# Patient Record
Sex: Male | Born: 1955 | Race: White | Hispanic: No | Marital: Married | Smoking: Never smoker
Health system: Southern US, Community
[De-identification: ages and names within clinical notes are randomized; demographics above are authoritative.]

## PROBLEM LIST (undated history)

## (undated) DIAGNOSIS — K219 Gastro-esophageal reflux disease without esophagitis: Secondary | ICD-10-CM

## (undated) HISTORY — DX: Gastro-esophageal reflux disease without esophagitis: K21.9

## (undated) HISTORY — PX: OTHER SURGICAL HISTORY: SHX169

---

## 1998-01-04 ENCOUNTER — Other Ambulatory Visit: Admission: RE | Admit: 1998-01-04 | Discharge: 1998-01-04 | Payer: Self-pay | Admitting: Urology

## 2008-05-30 ENCOUNTER — Ambulatory Visit: Payer: Self-pay | Admitting: Internal Medicine

## 2008-06-04 ENCOUNTER — Encounter: Admission: RE | Admit: 2008-06-04 | Discharge: 2008-06-04 | Payer: Self-pay | Admitting: Family Medicine

## 2008-06-14 ENCOUNTER — Ambulatory Visit: Payer: Self-pay | Admitting: Internal Medicine

## 2008-06-14 ENCOUNTER — Encounter: Payer: Self-pay | Admitting: Internal Medicine

## 2008-06-16 ENCOUNTER — Encounter: Payer: Self-pay | Admitting: Internal Medicine

## 2012-10-13 ENCOUNTER — Other Ambulatory Visit (HOSPITAL_COMMUNITY): Payer: Self-pay | Admitting: Family Medicine

## 2012-10-13 DIAGNOSIS — R05 Cough: Secondary | ICD-10-CM

## 2012-10-24 ENCOUNTER — Encounter (HOSPITAL_COMMUNITY): Payer: Self-pay

## 2013-05-12 ENCOUNTER — Encounter: Payer: Self-pay | Admitting: Internal Medicine

## 2013-12-06 ENCOUNTER — Encounter: Payer: Self-pay | Admitting: Internal Medicine

## 2014-01-11 ENCOUNTER — Encounter: Payer: Self-pay | Admitting: Internal Medicine

## 2018-10-04 ENCOUNTER — Ambulatory Visit (INDEPENDENT_AMBULATORY_CARE_PROVIDER_SITE_OTHER): Payer: 59 | Admitting: Internal Medicine

## 2018-10-04 ENCOUNTER — Encounter: Payer: Self-pay | Admitting: Internal Medicine

## 2018-10-04 ENCOUNTER — Ambulatory Visit (INDEPENDENT_AMBULATORY_CARE_PROVIDER_SITE_OTHER): Payer: 59

## 2018-10-04 ENCOUNTER — Other Ambulatory Visit: Payer: Self-pay

## 2018-10-04 DIAGNOSIS — J45991 Cough variant asthma: Secondary | ICD-10-CM | POA: Insufficient documentation

## 2018-10-04 LAB — CBC WITH DIFFERENTIAL/PLATELET
Basophils Absolute: 0 10*3/uL (ref 0.0–0.1)
Basophils Relative: 0.3 % (ref 0.0–3.0)
Eosinophils Absolute: 0.2 10*3/uL (ref 0.0–0.7)
Eosinophils Relative: 2.9 % (ref 0.0–5.0)
HCT: 42.4 % (ref 39.0–52.0)
Hemoglobin: 14.2 g/dL (ref 13.0–17.0)
Lymphocytes Relative: 30.3 % (ref 12.0–46.0)
Lymphs Abs: 2 10*3/uL (ref 0.7–4.0)
MCHC: 33.6 g/dL (ref 30.0–36.0)
MCV: 90 fl (ref 78.0–100.0)
Monocytes Absolute: 0.7 10*3/uL (ref 0.1–1.0)
Monocytes Relative: 10.2 % (ref 3.0–12.0)
Neutro Abs: 3.8 10*3/uL (ref 1.4–7.7)
Neutrophils Relative %: 56.3 % (ref 43.0–77.0)
Platelets: 237 10*3/uL (ref 150.0–400.0)
RBC: 4.71 Mil/uL (ref 4.22–5.81)
RDW: 12.9 % (ref 11.5–15.5)
WBC: 6.7 10*3/uL (ref 4.0–10.5)

## 2018-10-04 MED ORDER — FAMOTIDINE 20 MG PO TABS: ORAL_TABLET | ORAL | 11 refills | Status: DC

## 2018-10-04 MED ORDER — PREDNISONE 10 MG PO TABS: ORAL_TABLET | ORAL | 0 refills | Status: DC

## 2018-10-04 MED ORDER — BENZONATATE 200 MG PO CAPS: 200.0000 mg | ORAL_CAPSULE | Freq: Three times a day (TID) | ORAL | 1 refills | Status: DC | PRN

## 2018-10-04 MED ORDER — PANTOPRAZOLE SODIUM 40 MG PO TBEC: 40.0000 mg | DELAYED_RELEASE_TABLET | Freq: Every day | ORAL | 2 refills | Status: DC

## 2018-10-04 NOTE — Patient Instructions (Addendum)
For cough > tessalon 200 mg every 6 hours as needed  Pantoprazole (protonix) 40 mg   Take  30-60 min before first meal of the day and Pepcid (famotidine)  20 mg one after supper until return to office - this is the best way to tell whether stomach acid is contributing to your problem.    Prednisone 10 mg take  4 each am x 2 days,   2 each am x 2 days,  1 each am x 2 days and stop   GERD (REFLUX)  is an extremely common cause of respiratory symptoms just like yours , many times with no obvious heartburn at all.    It can be treated with medication, but also with lifestyle changes including elevation of the head of your bed (ideally with 6 -8inch blocks under the headboard of your bed),  Smoking cessation, avoidance of late meals, excessive alcohol, and avoid fatty foods, chocolate, peppermint, colas, red wine, and acidic juices such as orange juice.  NO MINT OR MENTHOL PRODUCTS SO NO COUGH DROPS  USE SUGARLESS CANDY INSTEAD (Jolley ranchers or Stover's or Life Savers) or even ice chips will also do - the key is to swallow to prevent all throat clearing. NO OIL BASED VITAMINS - use powdered substitutes.  Avoid fish oil when coughing.  Please remember to go to the lab and x-ray department   for your tests - we will call you with the results when they are available.  Please schedule a follow up office visit in 4 weeks, sooner if needed

## 2018-10-04 NOTE — Progress Notes (Signed)
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 [place] Kyle Travis  Evolved to year round w/i n a few years not better on inhalers or otc zyrtec  s assoc sob /wheezing so self referred to pulmonary clinic 10/04/2018      History of Present Illness  10/04/2018  Pulmonary/ 1st office eval/  Chief Complaint  Patient presents with  . Pulmonary Consult    Self referral. Pt c/o non prod cough x 15 years.   Dyspnea:  Not limited by breathing from desired activities   Cough: dry day > noct but worse just hs then worse again in am p start speaking  Sleep: fine bed flat one pillow  SABA use: none  Better if chews gum or swallows an alternative med ? Extract) but only while swallowing   [name] Reflux v Neurogenic Cough Differentiator Reflux Comments  Do you awaken from a sound sleep coughing violently?                            With trouble breathing? no   Do you have choking episodes when you cannot  Get enough air, gasping for air ?              no   Do you usually cough when you lie down into  The bed, or when you just lie down to rest ?                          Immediately then resolves p a couple of cough    Do you usually cough after meals or eating?         none   Do you cough when (or after) you bend over?    none   GERD SCORE     [name] Reflux v Neurogenic Cough Differentiator Neurogenic   Do you more-or-less cough all day long? Better with swallowing   Does change of temperature make you cough? no   Does laughing or chuckling cause you to cough? yes   Do fumes (perfume, automobile fumes, burned  Toast, etc.,) cause you to cough ?      no   Does speaking, singing, or talking on the phone cause you to cough   ?               Speaking    Neurogenic/Airway score       No obvious other patterns in  day to day or daytime variability or  assoc excess/ purulent sputum or mucus plugs or hemoptysis or cp or chest tightness, subjective wheeze or overt sinus or hb symptoms.   Sleeping most nights  without nocturnal  or early am exacerbation  of respiratory  c/o's or need for noct saba. Also denies any obvious fluctuation of symptoms with weather or environmental changes or other aggravating or alleviating factors except as outlined above   No unusual exposure hx or h/o childhood pna/ asthma or knowledge of premature birth.  Current Allergies, Complete Past Medical History, Past Surgical History, Family History, and Social History were reviewed in [facility] record.  ROS  The following are not active complaints unless bolded Hoarseness, sore throat, dysphagia, dental problems, itching, sneezing,  nasal congestion  or discharge of excess mucus or purulent secretions, ear ache,   fever, chills, sweats, unintended wt loss or wt gain, classically pleuritic or exertional cp,  orthopnea pnd or arm/hand swelling  or leg swelling, presyncope, palpitations, abdominal pain, anorexia, nausea, vomiting, diarrhea  or change in bowel habits or change in bladder habits, change in stools or change in urine, dysuria, hematuria,  rash, arthralgias, visual complaints, headache, numbness, weakness or ataxia or problems with walking or coordination,  change in mood or  memory.           No past medical history on file.  Outpatient Medications Prior to Visit  Medication Sig Dispense Refill  . UNABLE TO FIND Med Name: herbal supplement     No facility-administered medications prior to visit.      Objective:     BP 110/80 (BP Location: Left Arm, Cuff Size: Normal)   Pulse (!) 108   Temp 98.8 F (37.1 C) (Oral)   Ht 6\' 1"  (1.854 m)   Wt 214 lb (97.1 kg)   SpO2 94%   BMI 28.23 kg/m   SpO2: 94 % RA   HEENT: nl dentition, turbinates bilaterally, and oropharynx. Nl external ear canals without cough reflex   NECK :   without JVD/Nodes/TM/ nl carotid upstrokes bilaterally   LUNGS: no acc muscle use,  Nl contour chest which is clear to A and P bilaterally without cough on insp or exp maneuvers   CV:  RRR  no s3 or murmur or increase in P2, and no edema   ABD:  soft and nontender with nl inspiratory excursion in the supine position. No bruits or organomegaly appreciated, bowel sounds nl  MS:  Nl gait/ ext warm without deformities, calf tenderness, cyanosis or clubbing No obvious joint restrictions   SKIN: warm and dry without lesions    NEURO:  alert, approp, nl sensorium with  no motor or cerebellar deficits apparent.    CXR PA and Lateral:   10/04/2018 :    I personally reviewed images and   impression as follows:   wnl      Labs ordered 10/04/2018  Allergy profile      Assessment   Cough variant asthma vs uacs  Onset around 2005  - Allergy profile 10/04/2018 >  Eos 0.2 /  IgE pending      Better with swallowing and worse with voice use/ absence during heavy sleep all point strongly away from asthma and toward  Upper airway cough syndrome (previously labeled PNDS),  is so named because it's frequently impossible to sort out how much is  CR/sinusitis with freq throat clearing (which can be related to primary GERD)   vs  causing  secondary (" extra esophageal")  GERD from wide swings in gastric pressure that occur with throat clearing, often  promoting self use of mint and menthol lozenges that reduce the lower esophageal sphincter tone and exacerbate the problem further in a cyclical fashion.   These are the same pts (now being labeled as having "irritable larynx syndrome" by some cough centers) who not infrequently have a history of having failed to tolerate ace inhibitors,  dry powder inhalers or biphosphonates or report having atypical/extraesophageal reflux symptoms that don't respond to standard doses of PPI  and are easily confused as having aecopd or asthma flares by even experienced  allergists/ pulmonologists (myself included).  Of the three most common causes of  Sub-acute / recurrent or chronic cough, only one (GERD)  can actually  contribute to/ trigger  the other two (asthma and post nasal drip syndrome)  and perpetuate the cylce of cough.  While not intuitively obvious, many patients with chronic low grade reflux do not cough until there is a primary insult that disturbs the protective epithelial barrier and exposes sensitive nerve endings.   This is typically viral but can due to PNDS and  either may apply here.      >>>> The point is that once this occurs, it is difficult to eliminate the cycle  using anything but a maximally effective acid suppression regimen at least in the short run, accompanied by an appropriate diet to address non acid GERD and control / eliminate the cough itself for at least 3 days with tessalon while addressing any Th-2 driven inflammation with short course prednisone then regroup in 4 weeks  The standardized cough guidelines published in Chest by Lissa Morales in 2006 are still the best available and consist of a multiple step process (up to 12!) , not a single office visit,  and are intended  to address this problem logically,  with an alogrithm dependent on response to empiric treatment at  each progressive step  to determine a specific diagnosis with  minimal addtional testing needed. Therefore if adherence is an issue or can't be accurately verified,  it's very unlikely the standard evaluation and treatment will be successful here.    Furthermore, response to therapy (other than acute cough suppression, which should only be used short term with avoidance of narcotic containing cough syrups if possible), can be a gradual process for which the patient is not likely to  perceive immediate benefit.  Unlike going to an eye doctor where the best perscription is almost always the first one and is immediately effective, this is almost never the case in  the management of chronic cough syndromes. Therefore the patient needs to commit up front to consistently adhere to recommendations  for up to 6 weeks of therapy directed at the likely underlying problem(s) before the response can be reasonably evaluated.     Total time devoted to counseling  > 50 % of initial 60 min office visit:  review case with pt/ discussion of options/alternatives/ personally creating written customized instructions  in presence of pt  then going over those specific  Instructions directly with the pt including how to use all of the meds but in particular covering each new medication in detail and the difference between the maintenance= "automatic" meds and the prns using an action plan format for the latter (If this problem/symptom => do that organization reading Left to right).  Please see AVS from this visit for a full list of these instructions which I personally wrote for this pt and  are unique to this visit.      Christinia Gully, MD 10/04/2018

## 2018-10-05 ENCOUNTER — Encounter: Payer: Self-pay | Admitting: Internal Medicine

## 2018-10-05 LAB — RESPIRATORY ALLERGY PROFILE REGION II ~~LOC~~
Allergen, A. alternata, m6: <0.1 kU/L
Allergen, Cedar tree, t12: 3.29 kU/L — ABNORMAL HIGH
Allergen, Comm Silver Birch, t9: 3.48 kU/L — ABNORMAL HIGH
Allergen, Cottonwood, t14: 3.87 kU/L — ABNORMAL HIGH
Allergen, D pternoyssinus,d7: 0.24 kU/L — ABNORMAL HIGH
Allergen, Mouse Urine Protein, e78: 0.11 kU/L — ABNORMAL HIGH
Allergen, Mulberry, t76: 3.44 kU/L — ABNORMAL HIGH
Allergen, Oak,t7: 4.71 kU/L — ABNORMAL HIGH
Allergen, P. notatum, m1: <0.1 kU/L
Aspergillus fumigatus, m3: 0.11 kU/L — ABNORMAL HIGH
Bermuda Grass: 5.55 kU/L — ABNORMAL HIGH
Box Elder IgE: 4.8 kU/L — ABNORMAL HIGH
CLADOSPORIUM HERBARUM (M2) IGE: <0.1 kU/L
COMMON RAGWEED (SHORT) (W1) IGE: 5.64 kU/L — ABNORMAL HIGH
Cat Dander: 0.15 kU/L — ABNORMAL HIGH
Class: 0
Class: 0
Class: 0
Class: 0
Class: 0
Class: 0
Class: 0
Class: 1
Class: 2
Class: 2
Class: 2
Class: 2
Class: 2
Class: 3
Class: 3
Class: 3
Class: 3
Class: 3
Class: 3
Class: 3
Class: 3
Class: 3
Class: 3
Class: 3
Cockroach: 2.87 kU/L — ABNORMAL HIGH
D. farinae: 0.9 kU/L — ABNORMAL HIGH
Dog Dander: 0.54 kU/L — ABNORMAL HIGH
Elm IgE: 4.65 kU/L — ABNORMAL HIGH
IgE (Immunoglobulin E), Serum: 173 kU/L — ABNORMAL HIGH (ref <=114)
Johnson Grass: 6.07 kU/L — ABNORMAL HIGH
Pecan/Hickory Tree IgE: 4.01 kU/L — ABNORMAL HIGH
Rough Pigweed  IgE: 4.51 kU/L — ABNORMAL HIGH
Sheep Sorrel IgE: 5.79 kU/L — ABNORMAL HIGH
Timothy Grass: 6.56 kU/L — ABNORMAL HIGH

## 2018-10-05 LAB — INTERPRETATION:

## 2018-10-05 NOTE — Progress Notes (Signed)
Spoke with pt and notified of results per Dr. Wert. Pt verbalized understanding and denied any questions. 

## 2018-10-05 NOTE — Assessment & Plan Note (Addendum)
Onset around 2005  - Allergy profile 10/04/2018 >  Eos 0.2 /  IgE     Better with swallowing and worse with voice use/ absence during heavy sleep all point strongly away from asthma and toward  Upper airway cough syndrome (previously labeled PNDS),  is so named because it's frequently impossible to sort out how much is  CR/sinusitis with freq throat clearing (which can be related to primary GERD)   vs  causing  secondary (" extra esophageal")  GERD from wide swings in gastric pressure that occur with throat clearing, often  promoting self use of mint and menthol lozenges that reduce the lower esophageal sphincter tone and exacerbate the problem further in a cyclical fashion.   These are the same pts (now being labeled as having "irritable larynx syndrome" by some cough centers) who not infrequently have a history of having failed to tolerate ace inhibitors,  dry powder inhalers or biphosphonates or report having atypical/extraesophageal reflux symptoms that don't respond to standard doses of PPI  and are easily confused as having aecopd or asthma flares by even experienced allergists/ pulmonologists (myself included).  Of the three most common causes of  Sub-acute / recurrent or chronic cough, only one (GERD)  can actually contribute to/ trigger  the other two (asthma and post nasal drip syndrome)  and perpetuate the cylce of cough.  While not intuitively obvious, many patients with chronic low grade reflux do not cough until there is a primary insult that disturbs the protective epithelial barrier and exposes sensitive nerve endings.   This is typically viral but can due to PNDS and  either may apply here.      >>>> The point is that once this occurs, it is difficult to eliminate the cycle  using anything but a maximally effective acid suppression regimen at least in the short run, accompanied by an appropriate diet to address non acid GERD and control / eliminate the cough itself for at least 3 days  with tessalon while addressing any Th-2 driven inflammation with short course prednisone then regroup in 4 weeks  The standardized cough guidelines published in Chest by Lissa Morales in 2006 are still the best available and consist of a multiple step process (up to 12!) , not a single office visit,  and are intended  to address this problem logically,  with an alogrithm dependent on response to empiric treatment at  each progressive step  to determine a specific diagnosis with  minimal addtional testing needed. Therefore if adherence is an issue or can't be accurately verified,  it's very unlikely the standard evaluation and treatment will be successful here.    Furthermore, response to therapy (other than acute cough suppression, which should only be used short term with avoidance of narcotic containing cough syrups if possible), can be a gradual process for which the patient is not likely to  perceive immediate benefit.  Unlike going to an eye doctor where the best perscription is almost always the first one and is immediately effective, this is almost never the case in the management of chronic cough syndromes. Therefore the patient needs to commit up front to consistently adhere to recommendations  for up to 6 weeks of therapy directed at the likely underlying problem(s) before the response can be reasonably evaluated.     Total time devoted to counseling  > 50 % of initial 60 min office visit:  review case with pt/ discussion of options/alternatives/ personally creating written customized instructions  in presence of pt  then going over those specific  Instructions directly with the pt including how to use all of the meds but in particular covering each new medication in detail and the difference between the maintenance= "automatic" meds and the prns using an action plan format for the latter (If this problem/symptom => do that organization reading Left to right).  Please see AVS from this visit for  a full list of these instructions which I personally wrote for this pt and  are unique to this visit.

## 2018-10-11 ENCOUNTER — Encounter: Payer: Self-pay | Admitting: Internal Medicine

## 2018-10-14 ENCOUNTER — Telehealth: Payer: Self-pay | Admitting: Internal Medicine

## 2018-10-14 MED ORDER — MONTELUKAST SODIUM 10 MG PO TABS: 10.0000 mg | ORAL_TABLET | Freq: Every day | ORAL | 1 refills | Status: DC

## 2018-10-14 NOTE — Telephone Encounter (Signed)
Called spoke with patient regarding lab results. Order placed to  [facility] per patient request. Nothing further needed at this time.

## 2018-11-01 ENCOUNTER — Other Ambulatory Visit: Payer: Self-pay

## 2018-11-01 ENCOUNTER — Encounter: Payer: Self-pay | Admitting: Internal Medicine

## 2018-11-01 ENCOUNTER — Ambulatory Visit (INDEPENDENT_AMBULATORY_CARE_PROVIDER_SITE_OTHER): Payer: 59 | Admitting: Internal Medicine

## 2018-11-01 DIAGNOSIS — J45991 Cough variant asthma: Secondary | ICD-10-CM

## 2018-11-01 MED ORDER — PANTOPRAZOLE SODIUM 40 MG PO TBEC: 40.0000 mg | DELAYED_RELEASE_TABLET | Freq: Every day | ORAL | 2 refills | Status: DC

## 2018-11-01 NOTE — Patient Instructions (Signed)
Ok to take zyrtec as needed for itching/ sneezy/  dripping nose as needed   Ok to take your herbal for cough medicine as needed   Please schedule a follow up visit in 3 months but call sooner if needed

## 2018-11-01 NOTE — Assessment & Plan Note (Signed)
Onset around 2005  - Allergy profile 10/04/2018 >  Eos 0.2 /  IgE  173 pan allergic esp grass, trees, ragweed > added singulair 10/06/2018  - 10/04/2018 max rx for gerd/ prn tessalon >>>  Resolved 11/01/2018   Adequate control on present rx, reviewed in detail with pt > no change in rx needed    F/u in 3 m and consider then weaning off acid suppression  - if continues to flare consider MCT and or allergy referral.    I had an extended discussion with the patient reviewing all relevant studies completed to date and  lasting 15 to 20 minutes of a 25 minute visit    Each maintenance medication was reviewed in detail including most importantly the difference between maintenance and prns and under what circumstances the prns are to be triggered using an action plan format that is not reflected in the computer generated alphabetically organized AVS.     Please see AVS for specific instructions unique to this visit that I personally wrote and verbalized to the the pt in detail and then reviewed with pt  by my nurse highlighting any  changes in therapy recommended at today's visit to their plan of care.

## 2018-11-01 NOTE — Progress Notes (Signed)
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 Kyle Travis  Evolved to year round w/i n a few years not better on inhalers or otc zyrtec  s assoc sob /wheezing so self referred to pulmonary clinic 10/04/2018      History of Present Illness  10/04/2018  Pulmonary/ 1st office eval/Kyle Travis  Chief Complaint  Patient presents with  . Pulmonary Consult    Self referral. Pt c/o non prod cough x 15 years.   Dyspnea:  Not limited by breathing from desired activities   Cough: dry day > noct but worse just hs then worse again in am p start speaking  Sleep: fine bed flat one pillow  SABA use: none  Better if chews gum or swallows an alternative med ? Extract) but only while swallowing   Kyle Travis v Neurogenic Cough Differentiator Travis Comments  Do you awaken from a sound sleep coughing violently?                            With trouble breathing? no   Do you have choking episodes when you cannot  Get enough air, gasping for air ?              no   Do you usually cough when you lie down into  The bed, or when you just lie down to rest ?                          Immediately then resolves p a couple of cough    Do you usually cough after meals or eating?         none   Do you cough when (or after) you bend over?    none   GERD SCORE     Kyle Travis v Neurogenic Cough Differentiator Neurogenic   Do you more-or-less cough all day long? Better with swallowing   Does change of temperature make you cough? no   Does laughing or chuckling cause you to cough? yes   Do fumes (perfume, automobile fumes, burned  Toast, etc.,) cause you to cough ?      no   Does speaking, singing, or talking on the phone cause you to cough   ?               Speaking    Neurogenic/Airway score      rec For cough > tessalon 200 mg every 6 hours as needed Pantoprazole  (protonix) 40 mg   Take  30-60 min before first meal of the day and Pepcid (famotidine)  20 mg one after supper until return to office  .   Prednisone 10 mg take  4 each am x 2 days,   2 each am x 2 days,  1 each am x 2 days and stop  GERD diet singulair added due to positive allergy profile/ zyrtec stopped    11/01/2018  f/u ov/Kyle Travis re: uacs improved on singulair/ ppi qam and pepcid hs  Chief Complaint  Patient presents with  . Follow-up    Cough is much improved. No new co's.   Dyspnea:  Not limited by breathing from desired activities   Cough: occ hs / no longer taking  any cough meds at all  Sleeping: fine flat  SABA use: no  02: no    No obvious day to day or daytime variability or assoc excess/ purulent sputum or mucus plugs or hemoptysis or cp or chest tightness, subjective wheeze or overt sinus or hb symptoms.   Sleeping  without nocturnal  or early am exacerbation  of respiratory  c/o's or need for noct saba. Also denies any obvious fluctuation of symptoms with weather or environmental changes or other aggravating or alleviating factors except as outlined above   No unusual exposure hx or h/o childhood pna/ asthma or knowledge of premature birth.  Current Allergies, Complete Past Medical History, Past Surgical History, Family History, and Social History were reviewed in [employer] record.  ROS  The following are not active complaints unless bolded Hoarseness, sore throat, dysphagia, dental problems, itching, sneezing,  nasal congestion or discharge of excess mucus or purulent secretions, ear ache,   fever, chills, sweats, unintended wt loss or wt gain, classically pleuritic or exertional cp,  orthopnea pnd or arm/hand swelling  or leg swelling, presyncope, palpitations, abdominal pain, anorexia, nausea, vomiting, diarrhea  or change in bowel habits or change in bladder habits, change in stools or change in urine, dysuria, hematuria,  rash, arthralgias,  visual complaints, headache, numbness, weakness or ataxia or problems with walking or coordination,  change in mood or  memory.        Current Meds  Medication Sig  .     . famotidine (PEPCID) 20 MG tablet One after supper  . montelukast (SINGULAIR) 10 MG tablet Take 1 tablet (10 mg total) by mouth at bedtime.  . pantoprazole (PROTONIX) 40 MG tablet Take 1 tablet (40 mg total) by mouth daily. Take 30-60 min before first meal of the day                  Objective:      amb wm nad  Wt Readings from Last 3 Encounters:  11/01/18 212 lb 9.6 oz (96.4 kg)  10/04/18 214 lb (97.1 kg)     Vital signs reviewed - Note on arrival 02 sats  96% on RA      HEENT: nl dentition, turbinates bilaterally, and oropharynx. Nl external ear canals without cough reflex   NECK :  without JVD/Nodes/TM/ nl carotid upstrokes bilaterally   LUNGS: no acc muscle use,  Nl contour chest which is clear to A and P bilaterally without cough on insp or exp maneuvers   CV:  RRR  no s3 or murmur or increase in P2, and no edema   ABD:  soft and nontender with nl inspiratory excursion in the supine position. No bruits or organomegaly appreciated, bowel sounds nl  MS:  Nl gait/ ext warm without deformities, calf tenderness, cyanosis or clubbing No obvious joint restrictions   SKIN: warm and dry without lesions    NEURO:  alert, approp, nl sensorium with  no motor or cerebellar deficits apparent.          Assessment

## 2018-11-05 ENCOUNTER — Other Ambulatory Visit: Payer: Self-pay | Admitting: Internal Medicine

## 2018-11-25 ENCOUNTER — Other Ambulatory Visit: Payer: Self-pay | Admitting: Internal Medicine

## 2018-11-25 DIAGNOSIS — J45991 Cough variant asthma: Secondary | ICD-10-CM

## 2018-12-26 ENCOUNTER — Other Ambulatory Visit: Payer: Self-pay | Admitting: Internal Medicine

## 2018-12-26 DIAGNOSIS — J45991 Cough variant asthma: Secondary | ICD-10-CM

## 2019-01-13 ENCOUNTER — Encounter: Payer: Self-pay | Admitting: Internal Medicine

## 2019-01-17 ENCOUNTER — Other Ambulatory Visit: Payer: Self-pay

## 2019-01-17 DIAGNOSIS — Z20822 Contact with and (suspected) exposure to covid-19: Secondary | ICD-10-CM

## 2019-01-19 LAB — NOVEL CORONAVIRUS, NAA: SARS-CoV-2, NAA: NOT DETECTED

## 2019-02-01 ENCOUNTER — Ambulatory Visit: Payer: 59 | Admitting: Internal Medicine

## 2019-03-16 ENCOUNTER — Other Ambulatory Visit: Payer: Self-pay | Admitting: Internal Medicine

## 2019-03-16 DIAGNOSIS — J45991 Cough variant asthma: Secondary | ICD-10-CM

## 2019-07-06 ENCOUNTER — Ambulatory Visit: Payer: 59 | Attending: Internal Medicine

## 2019-07-06 DIAGNOSIS — Z23 Encounter for immunization: Secondary | ICD-10-CM

## 2019-07-06 NOTE — Progress Notes (Signed)
   Covid-19 Vaccination Clinic  Name:  Kyle Travis    MRN: [id number] DOB: [birth year]  07/06/2019  [name] was observed post Covid-19 immunization for 15 minutes without incident. He was provided with Vaccine Information Sheet and instruction to access the V-Safe system.   [name] was instructed to call 911 with any severe reactions post vaccine: Marland Kitchen Difficulty breathing  . Swelling of face and throat  . A fast heartbeat  . A bad rash all over body  . Dizziness and weakness   Immunizations Administered    Name Date Dose VIS Date Route   Pfizer COVID-19 Vaccine 07/06/2019  1:04 PM 0.3 mL 03/17/2019 Intramuscular   Manufacturer: [place]   Lot: [id number]   [place]: [id number]

## 2019-07-31 ENCOUNTER — Ambulatory Visit: Payer: 59 | Attending: Internal Medicine

## 2019-07-31 DIAGNOSIS — Z23 Encounter for immunization: Secondary | ICD-10-CM

## 2019-07-31 NOTE — Progress Notes (Signed)
   Covid-19 Vaccination Clinic  Name:  NIYAN SCHOOF    MRN: [id number] DOB: [birth year]  07/31/2019  Mr. Kravchuk was observed post Covid-19 immunization for 15 minutes without incident. He was provided with Vaccine Information Sheet and instruction to access the V-Safe system.   Mr. Hasman was instructed to call 911 with any severe reactions post vaccine: Marland Kitchen Difficulty breathing  . Swelling of face and throat  . A fast heartbeat  . A bad rash all over body  . Dizziness and weakness   Immunizations Administered    Name Date Dose VIS Date Route   Pfizer COVID-19 Vaccine 07/31/2019  1:52 PM 0.3 mL 05/31/2018 Intramuscular   Manufacturer: [place]   Lot: [id number]   [place]: [id number]

## 2019-09-20 ENCOUNTER — Other Ambulatory Visit: Payer: Self-pay

## 2019-09-20 DIAGNOSIS — J45991 Cough variant asthma: Secondary | ICD-10-CM

## 2019-09-20 MED ORDER — PANTOPRAZOLE SODIUM 40 MG PO TBEC: DELAYED_RELEASE_TABLET | ORAL | 0 refills | Status: DC

## 2019-10-24 ENCOUNTER — Other Ambulatory Visit: Payer: Self-pay | Admitting: Internal Medicine

## 2019-10-24 DIAGNOSIS — J45991 Cough variant asthma: Secondary | ICD-10-CM

## 2019-10-24 MED ORDER — PANTOPRAZOLE SODIUM 40 MG PO TBEC: DELAYED_RELEASE_TABLET | ORAL | 0 refills | Status: DC

## 2019-12-15 ENCOUNTER — Other Ambulatory Visit: Payer: Self-pay | Admitting: Internal Medicine

## 2019-12-15 DIAGNOSIS — J45991 Cough variant asthma: Secondary | ICD-10-CM

## 2019-12-30 ENCOUNTER — Other Ambulatory Visit: Payer: Self-pay | Admitting: Internal Medicine

## 2019-12-30 DIAGNOSIS — J45991 Cough variant asthma: Secondary | ICD-10-CM

## 2019-12-30 MED ORDER — PANTOPRAZOLE SODIUM 40 MG PO TBEC: DELAYED_RELEASE_TABLET | ORAL | 0 refills | Status: DC

## 2020-01-22 ENCOUNTER — Other Ambulatory Visit: Payer: Self-pay | Admitting: Internal Medicine

## 2020-01-22 DIAGNOSIS — J45991 Cough variant asthma: Secondary | ICD-10-CM

## 2020-03-22 ENCOUNTER — Ambulatory Visit: Payer: 59 | Attending: Internal Medicine

## 2020-03-22 DIAGNOSIS — Z23 Encounter for immunization: Secondary | ICD-10-CM

## 2020-03-22 NOTE — Progress Notes (Signed)
   Covid-19 Vaccination Clinic  Name:  BREKEN NAZARI    MRN: [id number] DOB: 06/27/1955  03/22/2020  Mr. Robling was observed post Covid-19 immunization for 15 minutes without incident. He was provided with Vaccine Information Sheet and instruction to access the V-Safe system.   Mr. Mackie was instructed to call 911 with any severe reactions post vaccine: Marland Kitchen Difficulty breathing  . Swelling of face and throat  . A fast heartbeat  . A bad rash all over body  . Dizziness and weakness   Immunizations Administered    Name Date Dose VIS Date Route   Pfizer COVID-19 Vaccine 03/22/2020  1:15 PM 0.3 mL 01/24/2020 Intramuscular   Manufacturer: [place]   Lot: [id number]   [place]: [id number]

## 2020-03-23 ENCOUNTER — Ambulatory Visit: Payer: 59

## 2020-04-02 ENCOUNTER — Other Ambulatory Visit: Payer: Self-pay | Admitting: Internal Medicine

## 2020-04-02 DIAGNOSIS — J45991 Cough variant asthma: Secondary | ICD-10-CM

## 2020-04-03 ENCOUNTER — Other Ambulatory Visit: Payer: Self-pay | Admitting: Internal Medicine

## 2020-04-03 DIAGNOSIS — J45991 Cough variant asthma: Secondary | ICD-10-CM

## 2020-04-16 ENCOUNTER — Telehealth: Payer: Self-pay | Admitting: Internal Medicine

## 2020-04-16 NOTE — Telephone Encounter (Signed)
disregard

## 2020-04-25 ENCOUNTER — Other Ambulatory Visit: Payer: Self-pay

## 2020-04-25 ENCOUNTER — Ambulatory Visit (INDEPENDENT_AMBULATORY_CARE_PROVIDER_SITE_OTHER): Payer: 59 | Admitting: Internal Medicine

## 2020-04-25 ENCOUNTER — Encounter: Payer: Self-pay | Admitting: Internal Medicine

## 2020-04-25 DIAGNOSIS — J45991 Cough variant asthma: Secondary | ICD-10-CM

## 2020-04-25 MED ORDER — PANTOPRAZOLE SODIUM 40 MG PO TBEC: DELAYED_RELEASE_TABLET | ORAL | 11 refills | Status: DC

## 2020-04-25 NOTE — Assessment & Plan Note (Signed)
Onset around 2005  - Allergy profile 10/04/2018 >  Eos 0.2 /  IgE  173 pan allergic esp grass, trees, ragweed > added singulair 10/06/2018  - 10/04/2018 max rx for gerd/ prn tessalon >>>  Resolved 11/01/2018    By trial and error clear that his problem is gerd and not allergic rhinitis or asthma and that he can control this mostly with diet/ wt loss and prn ppi  Discussed the recent press about ppi's in the context of a statistically significant (but questionably clinically relevant) increase in CRI in pts on ppi vs h2's > bottom line is the lowest dose of ppi that controls   gerd is the right dose and if that dose is zero that's fine esp since h2's are cheaper.   May be able to use ppi to get cough under control then wean to otc pepcid.  Declined referral to GI "never going back"   F/u can be q 1 y  - sooner prn if PCP not willing to refill ppi (or can use prilosec otc prn to substitute)          Each maintenance medication was reviewed in detail including emphasizing most importantly the difference between maintenance and prns and under what circumstances the prns are to be triggered using an action plan format where appropriate.  Total time for H and P, chart review, counseling, reviewing  and generating customized AVS unique to this office visit / same day charting = 26 min

## 2020-04-25 NOTE — Progress Notes (Signed)
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 Spring Kyle Travis  Evolved to year round w/i n a few years not better on inhalers or otc zyrtec  s assoc sob /wheezing so self referred to pulmonary clinic 10/04/2018      History of Present Illness  10/04/2018  Pulmonary/ 1st office eval/Kyle Travis  Chief Complaint  Patient presents with  . Pulmonary Consult    Self referral. Pt c/o non prod cough x 15 years.   Dyspnea:  Not limited by breathing from desired activities   Cough: dry day > noct but worse just hs then worse again in am p start speaking  Sleep: fine bed flat one pillow  SABA use: none  Better if chews gum or swallows an alternative med ? Extract) but only while swallowing   [name] Reflux v Neurogenic Cough Differentiator Reflux Comments  Do you awaken from a sound sleep coughing violently?                            With trouble breathing? no   Do you have choking episodes when you cannot  Get enough air, gasping for air ?              no   Do you usually cough when you lie down into  The bed, or when you just lie down to rest ?                          Immediately then resolves p a couple of cough    Do you usually cough after meals or eating?         none   Do you cough when (or after) you bend over?    none   GERD SCORE     [name] Reflux v Neurogenic Cough Differentiator Neurogenic   Do you more-or-less cough all day long? Better with swallowing   Does change of temperature make you cough? no   Does laughing or chuckling cause you to cough? yes   Do fumes (perfume, automobile fumes, burned  Toast, etc.,) cause you to cough ?      no   Does speaking, singing, or talking on the phone cause you to cough   ?               Speaking    Neurogenic/Airway score      rec For cough > tessalon 200 mg every 6 hours as needed Pantoprazole  (protonix) 40 mg   Take  30-60 min before first meal of the day and Pepcid (famotidine)  20 mg one after supper until return to office  .   Prednisone 10 mg take  4 each am x 2 days,   2 each am x 2 days,  1 each am x 2 days and stop  GERD diet singulair added due to positive allergy profile/ zyrtec stopped    11/01/2018  f/u ov/Kyle Travis re: uacs improved on singulair/ ppi qam and pepcid hs  Chief Complaint  Patient presents with  . Follow-up    Cough is much improved. No new co's.   Dyspnea:  Not limited by breathing from desired activities   Cough: occ hs / no longer taking  any cough meds at all  Sleeping: fine flat  SABA use: no  02: no  Ok to take zyrtec as needed for itching/ sneezy/  dripping nose as needed  Ok to take your herbal for cough medicine as needed   11/2018 stopped pepcid and singulair and able to use ppi prn     04/25/2020    ov/Kyle Travis re: uacs with pos allergy testing but no seasonal symptoms or need for even zyrtec prn  Chief Complaint  Patient presents with  . Follow-up    Med. Refills-Protonix.Occass. cough  Dyspnea:  Good ex tol, working out with trainer  Cough: as fine as long as takes PPI - worse in daytime, dry, tickle sensation off ppi x 2 week prior to OV  Sleeping: no resp symptoms  SABA use: no  02: none   No obvious day to day or daytime variability or assoc excess/ purulent sputum or mucus plugs or hemoptysis or cp or chest tightness, subjective wheeze or overt sinus or hb symptoms.   Sleeping  without nocturnal  or early am exacerbation  of respiratory  c/o's or need for noct saba. Also denies any obvious fluctuation of symptoms with weather or environmental changes or other aggravating or alleviating factors except as outlined above   No unusual exposure hx or h/o childhood pna/ asthma or knowledge of premature birth.  Current Allergies, Complete Past Medical History, Past Surgical History, Family History, and Social History were reviewed in  [employer] record.  ROS  The following are not active complaints unless bolded Hoarseness, sore throat, dysphagia, dental problems, itching, sneezing,  nasal congestion or discharge of excess mucus or purulent secretions, ear ache,   fever, chills, sweats, unintended wt loss or wt gain, classically pleuritic or exertional cp,  orthopnea pnd or arm/hand swelling  or leg swelling, presyncope, palpitations, abdominal pain, anorexia, nausea, vomiting, diarrhea  or change in bowel habits or change in bladder habits, change in stools or change in urine, dysuria, hematuria,  rash, arthralgias, visual complaints, headache, numbness, weakness or ataxia or problems with walking or coordination,  change in mood or  memory.        Current Meds  Medication Sig  . pantoprazole (PROTONIX) 40 MG tablet TAKE 1 TABLET (40 MG TOTAL) BY MOUTH DAILY. TAKE 30-60 MINUTES BEFORE FIRST MEAL OF THE DAY                       Objective:         Wt Readings from Last 3 Encounters:  04/25/20 205 lb 6.4 oz (93.2 kg)  11/01/18 212 lb 9.6 oz (96.4 kg)  10/04/18 214 lb (97.1 kg)      Vital signs reviewed  04/25/2020  - Note at rest 02 sats  99% on RA   General appearance:    Pleasant healthy appearing amb wm nad   HEENT : pt wearing mask not removed for exam due to covid -19 concerns.    NECK :  without JVD/Nodes/TM/ nl carotid upstrokes bilaterally   LUNGS: no acc muscle use,  Nl contour chest which is clear to A and P bilaterally without cough on insp or exp maneuvers   CV:  RRR  no s3 or murmur or increase in P2, and no edema   ABD:  soft and nontender with nl inspiratory excursion in the supine position. No bruits or organomegaly appreciated, bowel sounds nl  MS:  Nl gait/ ext warm without deformities,  calf tenderness, cyanosis or clubbing No obvious joint restrictions   SKIN: warm and dry without lesions    NEURO:  alert, approp, nl sensorium with  no motor or  cerebellar deficits apparent.               Assessment

## 2020-04-25 NOTE — Patient Instructions (Signed)
Pepcid 20 mg taken after breakfast and supper may be a good alternative to protonix  If still coughing then start Pantoprazole (protonix) 40 mg   Take  30-60 min before first meal of the day and Pepcid (famotidine)  20 mg one @  bedtime until cough is gone for at least a week   If you are satisfied with your treatment plan,  let your doctor know and he/she can either refill your medications or you can return here when your prescription runs out.     If in any way you are not 100% satisfied,  please tell us.  If 100% better, tell your friends!  Pulmonary follow up is as needed

## 2020-05-07 ENCOUNTER — Other Ambulatory Visit: Payer: Self-pay

## 2020-05-07 ENCOUNTER — Ambulatory Visit (INDEPENDENT_AMBULATORY_CARE_PROVIDER_SITE_OTHER): Payer: 59 | Admitting: Dermatology

## 2020-05-07 ENCOUNTER — Encounter: Payer: Self-pay | Admitting: Dermatology

## 2020-05-07 DIAGNOSIS — L578 Other skin changes due to chronic exposure to nonionizing radiation: Secondary | ICD-10-CM | POA: Diagnosis not present

## 2020-05-07 DIAGNOSIS — Z1283 Encounter for screening for malignant neoplasm of skin: Secondary | ICD-10-CM

## 2020-05-07 DIAGNOSIS — L821 Other seborrheic keratosis: Secondary | ICD-10-CM | POA: Diagnosis not present

## 2020-05-07 DIAGNOSIS — L700 Acne vulgaris: Secondary | ICD-10-CM | POA: Diagnosis not present

## 2020-05-17 ENCOUNTER — Encounter: Payer: Self-pay | Admitting: Dermatology

## 2020-05-17 NOTE — Progress Notes (Signed)
   Follow-Up Visit   Subjective  Kyle Travis is a 65 y.o. male who presents for the following: Annual Exam (No concerns).  General skin examination Location: Rough spots on torso Duration:  Quality:  Associated Signs/Symptoms: Modifying Factors:  Severity:  Timing: Context:   Objective  Well appearing patient in no apparent distress; mood and affect are within normal limits. Objective  Head to Waist: Head to waist exam today. No signs of atypical moles, melanoma or non mole skin cancer.   Objective  Right Lower Back, Right Shoulder - Posterior: 4 to 5 mm brown textured flattopped papules  Objective  Left Outer Orbit, Right Outer Orbit: Multiple milia and comedones   All skin waist up examined.   Assessment & Plan    Skin exam for malignant neoplasm Head to Waist  Annual skin check, encouraged to self examine twice annually with spouse.  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     I, Lavonna Monarch, MD, have reviewed all documentation for this visit.  The documentation on 05/17/20 for the exam, diagnosis, procedures, and orders are all accurate and complete.

## 2020-05-22 ENCOUNTER — Ambulatory Visit: Payer: 59 | Admitting: Dermatology

## 2021-04-05 IMAGING — DX CHEST - 2 VIEW
2 series · 2 of 2 positions shown · non-contrast
Comparison: 06/04/2008

CLINICAL DATA: Cough

EXAM:
CHEST - 2 VIEW

[chest pa]
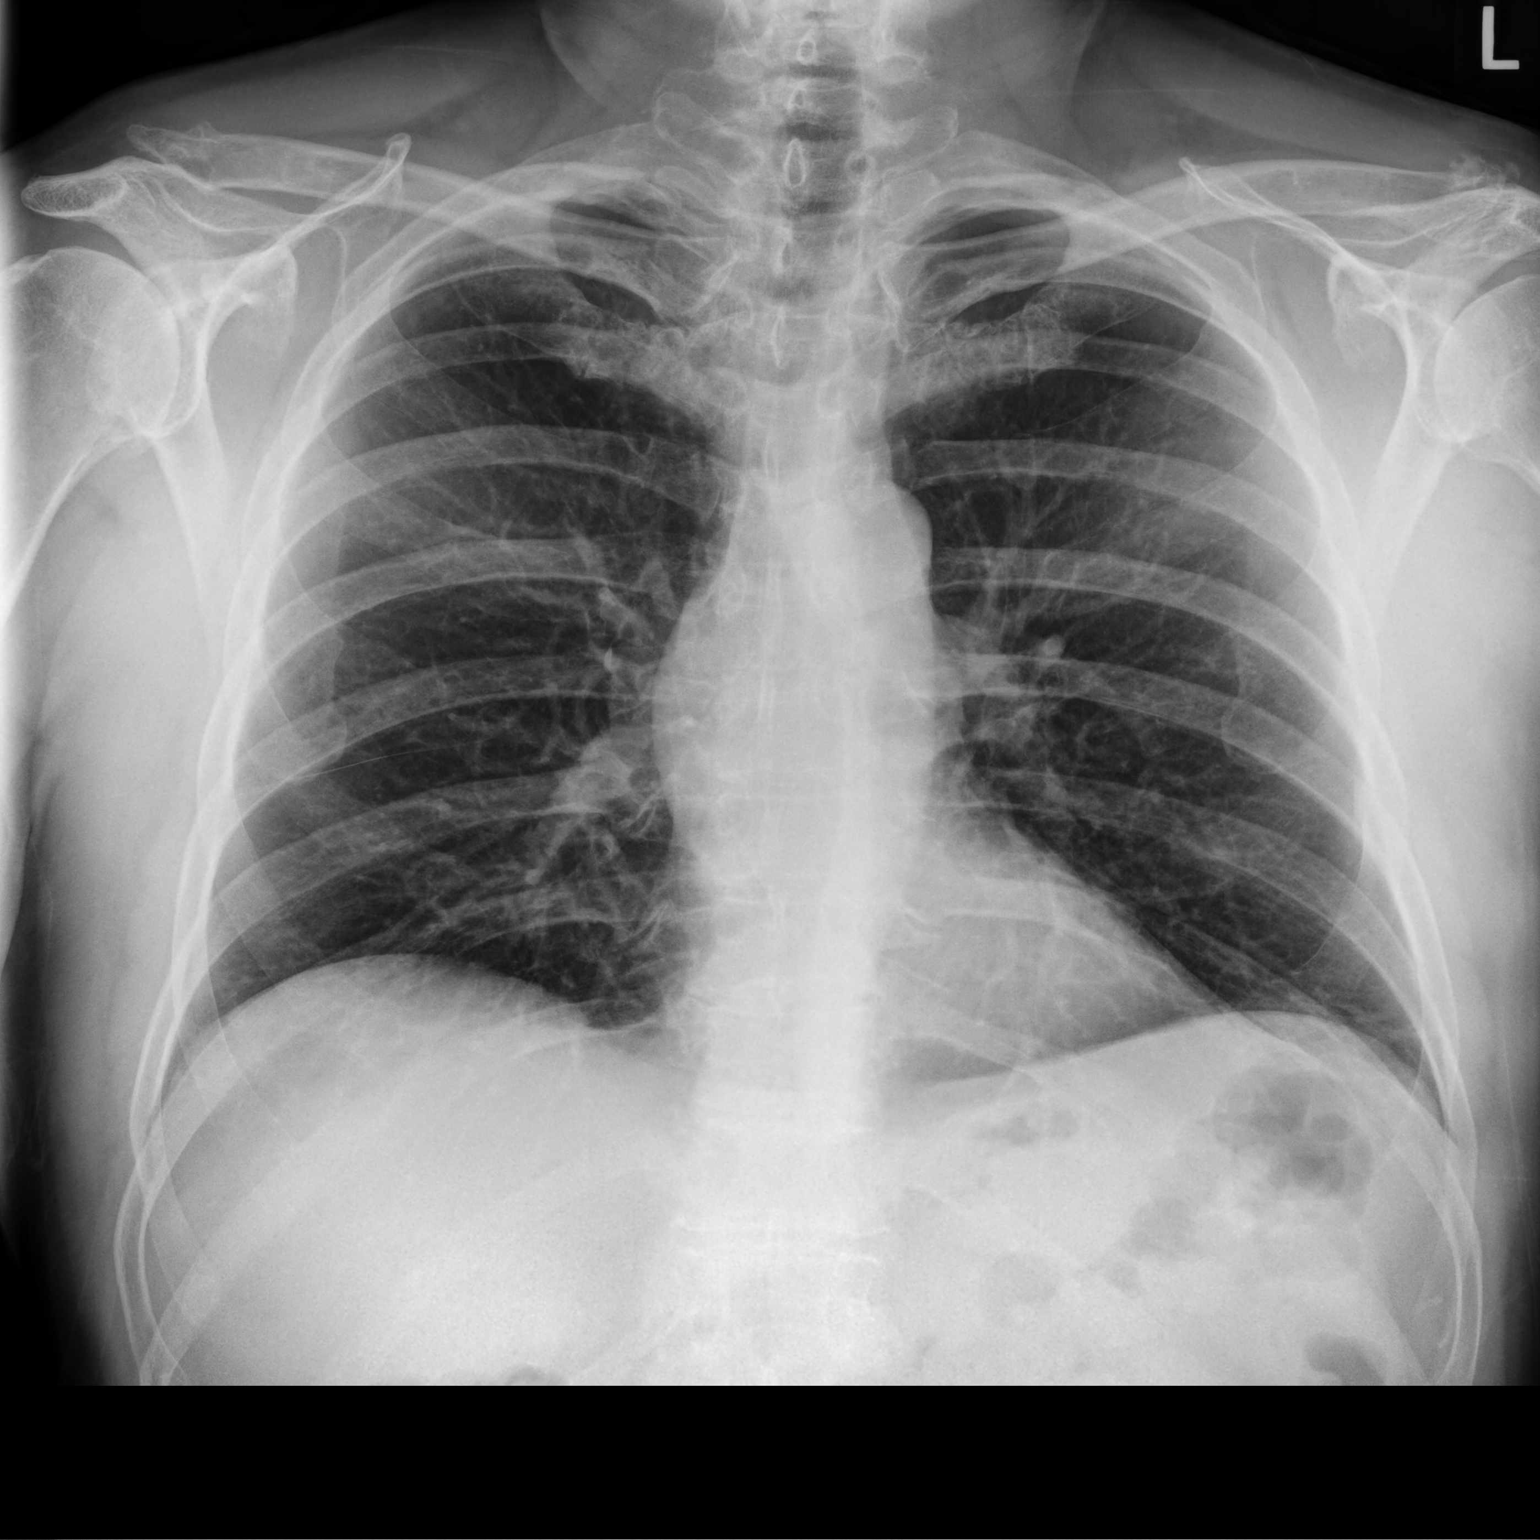

[chest lat]
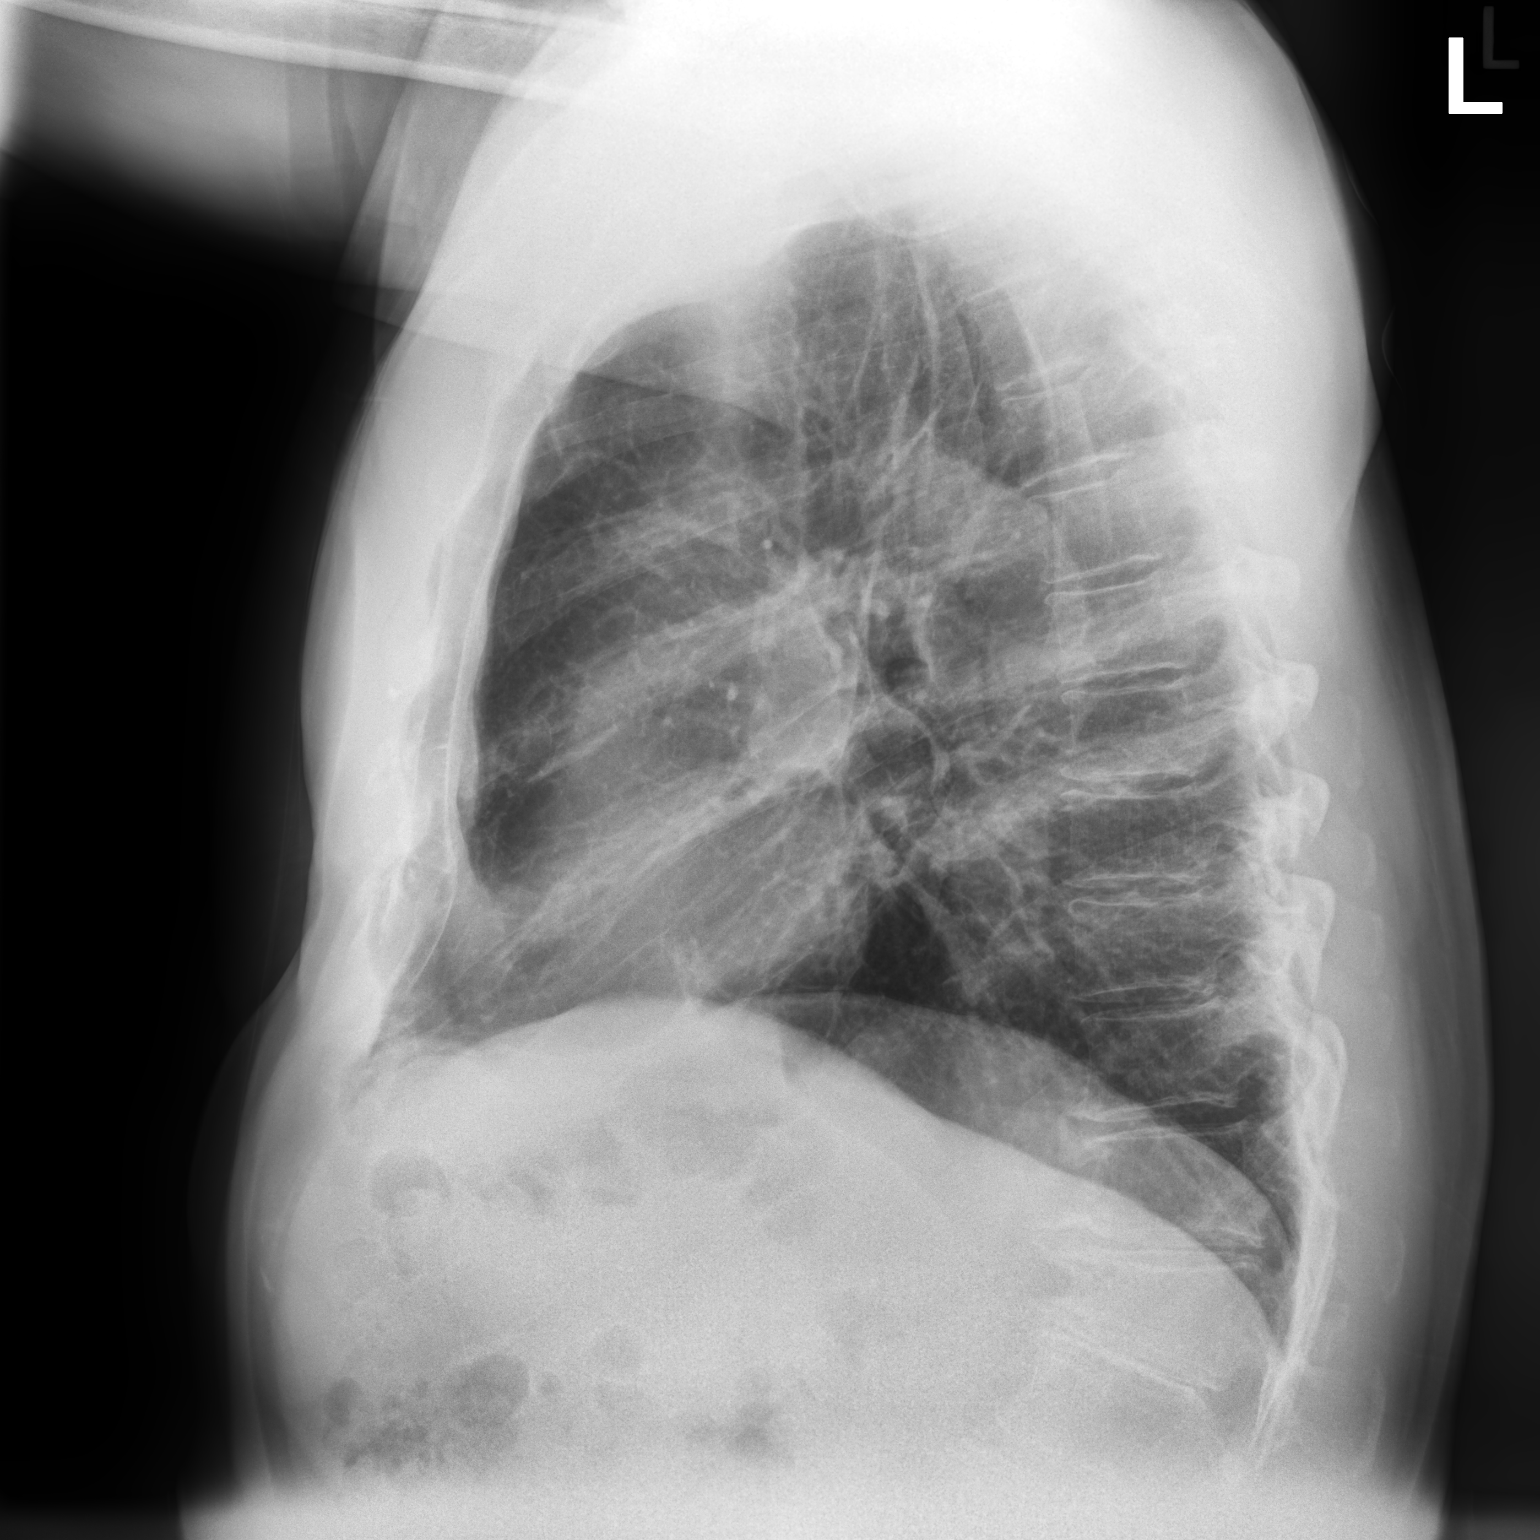

[2 of 2 positions shown; findings below may reference images not displayed]

FINDINGS: The heart size and mediastinal contours are within normal limits.
Both lungs are clear. Disc degenerative disease of the thoracic
spine.
IMPRESSION: No acute abnormality of the lungs.  No focal airspace opacity.

## 2021-05-07 ENCOUNTER — Other Ambulatory Visit: Payer: Self-pay

## 2021-05-07 ENCOUNTER — Encounter: Payer: Self-pay | Admitting: Dermatology

## 2021-05-07 ENCOUNTER — Ambulatory Visit (INDEPENDENT_AMBULATORY_CARE_PROVIDER_SITE_OTHER): Payer: 59 | Admitting: Dermatology

## 2021-05-07 ENCOUNTER — Telehealth: Payer: Self-pay

## 2021-05-07 DIAGNOSIS — L820 Inflamed seborrheic keratosis: Secondary | ICD-10-CM

## 2021-05-07 DIAGNOSIS — Z1283 Encounter for screening for malignant neoplasm of skin: Secondary | ICD-10-CM

## 2021-05-07 DIAGNOSIS — L219 Seborrheic dermatitis, unspecified: Secondary | ICD-10-CM | POA: Diagnosis not present

## 2021-05-07 DIAGNOSIS — D485 Neoplasm of uncertain behavior of skin: Secondary | ICD-10-CM

## 2021-05-07 MED ORDER — CLOBETASOL PROPIONATE 0.05 % EX FOAM: Freq: Every day | CUTANEOUS | 3 refills | Status: AC

## 2021-05-07 NOTE — Telephone Encounter (Signed)
Phone call from patient stating that he went to the Pharmacy and picked up the prescription of the Clobetasol Foam and he paid $350 for it. Patient states that he was told that the prescription was going to be $15 and he's upset that it wasn't $15.  I informed patient that he would need to contact the Pharmacy and tell them that he wanted to use Good [facility] and pay cash for the prescription.  Patient stated that he's going to the Pharmacy and have them fix the cost of the medication.

## 2021-05-30 ENCOUNTER — Encounter: Payer: Self-pay | Admitting: Dermatology

## 2021-05-30 NOTE — Progress Notes (Signed)
° °  Follow-Up Visit   Subjective  Kyle Travis is a 66 y.o. male who presents for the following: Annual Exam (Patient states he is having itchy scalp, x months. Not using any medication for it. Lesion on left forehead x months. ).  Itchy scalp and changing lesion on left temple Location:  Duration:  Quality:  Associated Signs/Symptoms: Modifying Factors:  Severity:  Timing: Context:   Objective  Well appearing patient in no apparent distress; mood and affect are within normal limits. Left Forehead 1 cm triangular-shaped pink and tan crust, favor irritated keratosis over superficial carcinoma       Mid Frontal Scalp More itching than visible inflammation and scale but will try 4 weeks of clobetasol on the affected area.    All skin waist up examined.   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  Skin / nail biopsy Type of biopsy: tangential   Informed consent: discussed and consent obtained   Timeout: patient name, date of birth, surgical site, and procedure verified   Anesthesia: the lesion was anesthetized in a standard fashion   Anesthetic:  1% lidocaine w/ epinephrine 1-100,000 local infiltration Instrument used: flexible razor blade   Hemostasis achieved with: aluminum chloride and electrodesiccation   Outcome: patient tolerated procedure well   Post-procedure details: wound care instructions given    Specimen 1 - Surgical pathology Differential Diagnosis: r/o atypia   Check Margins: No  Seborrheic dermatitis Mid Frontal Scalp  Daily clobetasol foam for up to 1 month, do not use on the areas.  clobetasol (OLUX) 0.05 % topical foam - Mid Frontal Scalp Apply topically daily.      I, Lavonna Monarch, MD, have reviewed all documentation for this visit.  The documentation on 05/30/21 for the exam, diagnosis, procedures, and orders are all accurate and complete.

## 2021-08-19 ENCOUNTER — Telehealth: Payer: Self-pay | Admitting: Dermatology

## 2021-08-19 NOTE — Telephone Encounter (Signed)
Patient left message on office voice mail that Lavonna Monarch, M.D. gave him a prescription for ?clobetasol ?clobetasol (OLUX) 0.05 % topical foam ?And he is not sure if he is using it right.  Patient states that 2 weeks after he uses the medication his scalp starts itching again.  Patient also wants to know if he needs to finish the prescription and if so when he does finish it if he is supposed to refill it. ?

## 2021-08-20 NOTE — Telephone Encounter (Signed)
Patient aware treatment not cure and Dr Delma Officer message was read to patient ?

## 2022-07-09 ENCOUNTER — Encounter: Payer: Self-pay | Admitting: Internal Medicine

## 2022-08-13 ENCOUNTER — Ambulatory Visit (AMBULATORY_SURGERY_CENTER): Payer: 59 | Admitting: *Deleted

## 2022-08-13 VITALS — Ht 73.0 in | Wt 190.0 lb

## 2022-08-13 DIAGNOSIS — Z86010 Personal history of colonic polyps: Secondary | ICD-10-CM

## 2022-08-13 MED ORDER — NA SULFATE-K SULFATE-MG SULF 17.5-3.13-1.6 GM/177ML PO SOLN: 1.0000 | Freq: Once | ORAL | 0 refills | Status: AC

## 2022-08-13 NOTE — Progress Notes (Signed)
Pt's name and DOB verified at the beginning of the pre-visit.  Pt denies any difficulty with ambulating,sitting, laying down or rolling side to side Gave both [facility] main # and MD on call # prior to instructions.  No egg or soy allergy known to patient  No issues known to pt with past sedation with any surgeries or procedures Pt denies having issues being intubated Pt has no issues moving head neck or swallowing No FH of Malignant Hyperthermia Pt is not on diet pills Pt is not on home 02  Pt is not on blood thinners  Pt denies issues with constipation  Pt is not on dialysis Pt denise any abnormal heart rhythms  Pt denies any upcoming cardiac testing Pt encouraged to use to use Singlecare or Goodrx to reduce cost  Patient's chart reviewed by Cathlyn Parsons CNRA prior to pre-visit and patient appropriate for the [facility].  Pre-visit completed and red dot placed by patient's name on their procedure day (on provider's schedule).  . Visit by phone Pt states weight is 190 lb Instructed pt why it is important to and  to call if they have any changes in health or new medications. Directed them to the # given and on instructions.   Pt states they will.  Instructions reviewed with pt and pt states understanding. Instructed to review again prior to procedure. Pt states they will.  Instructions sent by mail with coupon and by my chart

## 2022-09-02 ENCOUNTER — Ambulatory Visit (AMBULATORY_SURGERY_CENTER): Payer: Medicare Other | Admitting: Internal Medicine

## 2022-09-02 ENCOUNTER — Encounter: Payer: Self-pay | Admitting: Internal Medicine

## 2022-09-02 VITALS — BP 110/68 | HR 71 | Temp 98.3°F | Resp 12 | Ht 73.0 in | Wt 190.0 lb

## 2022-09-02 DIAGNOSIS — D123 Benign neoplasm of transverse colon: Secondary | ICD-10-CM | POA: Diagnosis not present

## 2022-09-02 DIAGNOSIS — D124 Benign neoplasm of descending colon: Secondary | ICD-10-CM

## 2022-09-02 DIAGNOSIS — Z86010 Personal history of colonic polyps: Secondary | ICD-10-CM

## 2022-09-02 DIAGNOSIS — Z09 Encounter for follow-up examination after completed treatment for conditions other than malignant neoplasm: Secondary | ICD-10-CM | POA: Diagnosis not present

## 2022-09-02 DIAGNOSIS — K621 Rectal polyp: Secondary | ICD-10-CM | POA: Diagnosis not present

## 2022-09-02 DIAGNOSIS — D128 Benign neoplasm of rectum: Secondary | ICD-10-CM

## 2022-09-02 MED ORDER — SODIUM CHLORIDE 0.9 % IV SOLN: 500.0000 mL | Freq: Once | INTRAVENOUS | Status: DC

## 2022-09-02 NOTE — Progress Notes (Signed)
Report to PACU, RN, vss, BBS= Clear.  

## 2022-09-02 NOTE — Progress Notes (Signed)
HISTORY OF PRESENT ILLNESS:  Kyle Travis is a 67 y.o. male with a history of adenomatous colon polyp in 2010.  Now for surveillance  REVIEW OF SYSTEMS:  All non-GI ROS negative .  Past Medical History:  Diagnosis Date   GERD (gastroesophageal reflux disease)     Past Surgical History:  Procedure Laterality Date   clavical surgery      20 years ago    Social History [employer]  reports that he has never smoked. He has never used smokeless tobacco. He reports current alcohol use. He reports that he does not use drugs.  family history includes Heart disease in his brother; Stomach cancer in his father.  No Known Allergies     PHYSICAL EXAMINATION: Vital signs: BP (!) 157/92   Pulse 83   Temp 98.3 F (36.8 C)   Ht 6\' 1"  (1.854 m)   Wt 190 lb (86.2 kg)   SpO2 98%   BMI 25.07 kg/m  General: Well-developed, well-nourished, no acute distress HEENT: Sclerae are anicteric, conjunctiva pink. Oral mucosa intact Lungs: Clear Heart: Regular Abdomen: soft, nontender, nondistended, no obvious ascites, no peritoneal signs, normal bowel sounds. No organomegaly. Extremities: No edema Psychiatric: alert and oriented x3. Cooperative     ASSESSMENT:  Personal history of adenomatous polyp   PLAN:  Surveillance colonoscopy

## 2022-09-02 NOTE — Progress Notes (Signed)
Called to room to assist during endoscopic procedure.  Patient ID and intended procedure confirmed with present staff. Received instructions for my participation in the procedure from the performing physician.  

## 2022-09-02 NOTE — Patient Instructions (Signed)
-  Handout on polyps and diverticulosis provided -await pathology results -repeat colonoscopy for surveillance recommended. Date to be determined when pathology result become available   -Continue present medications   YOU HAD AN ENDOSCOPIC PROCEDURE TODAY AT THE [facility]:   Refer to the procedure report that was given to you for any specific questions about what was found during the examination.  If the procedure report does not answer your questions, please call your gastroenterologist to clarify.  If you requested that your care partner not be given the details of your procedure findings, then the procedure report has been included in a sealed envelope for you to review at your convenience later.  YOU SHOULD EXPECT: Some feelings of bloating in the abdomen. Passage of more gas than usual.  Walking can help get rid of the air that was put into your GI tract during the procedure and reduce the bloating. If you had a lower endoscopy (such as a colonoscopy or flexible sigmoidoscopy) you may notice spotting of blood in your stool or on the toilet paper. If you underwent a bowel prep for your procedure, you may not have a normal bowel movement for a few days.  Please Note:  You might notice some irritation and congestion in your nose or some drainage.  This is from the oxygen used during your procedure.  There is no need for concern and it should clear up in a day or so.  SYMPTOMS TO REPORT IMMEDIATELY:  Following lower endoscopy (colonoscopy or flexible sigmoidoscopy):  Excessive amounts of blood in the stool  Significant tenderness or worsening of abdominal pains  Swelling of the abdomen that is new, acute  Fever of 100F or higher   For urgent or emergent issues, a gastroenterologist can be reached at any hour by calling [phone]. Do not use MyChart messaging for urgent concerns.    DIET:  We do recommend a small meal at first, but then you may proceed to your regular  diet.  Drink plenty of fluids but you should avoid alcoholic beverages for 24 hours.  ACTIVITY:  You should plan to take it easy for the rest of today and you should NOT DRIVE or use heavy machinery until tomorrow (because of the sedation medicines used during the test).    FOLLOW UP: Our staff will call the number listed on your records the next business day following your procedure.  We will call around 7:15- 8:00 am to check on you and address any questions or concerns that you may have regarding the information given to you following your procedure. If we do not reach you, we will leave a message.     If any biopsies were taken you will be contacted by phone or by letter within the next 1-3 weeks.  Please call us at [phone] if you have not heard about the biopsies in 3 weeks.    SIGNATURES/CONFIDENTIALITY: You and/or your care partner have signed paperwork which will be entered into your electronic medical record.  These signatures attest to the fact that that the information above on your After Visit Summary has been reviewed and is understood.  Full responsibility of the confidentiality of this discharge information lies with you and/or your care-partner.   

## 2022-09-02 NOTE — Op Note (Signed)
[facility] Patient Name: Kyle Travis Procedure Date: 09/02/2022 9:17 AM MRN: [id number] Endoscopist: Wilhemina Bonito. Marina Goodell , MD, [id number] Age: 67 Referring MD:  Date of Birth: Jun 07, 1955 Gender: Male Account #: [id number] Procedure:                Colonoscopy with cold snare polypectomy x 1; biopsy                            polypectomy x 1 Indications:              High risk colon cancer surveillance: Personal                            history of non-advanced adenoma. Previous                            examination 2010 Medicines:                Monitored Anesthesia Care Procedure:                Pre-Anesthesia Assessment:                           - Prior to the procedure, a History and Physical                            was performed, and patient medications and                            allergies were reviewed. The patient's tolerance of                            previous anesthesia was also reviewed. The risks                            and benefits of the procedure and the sedation                            options and risks were discussed with the patient.                            All questions were answered, and informed consent                            was obtained. Prior Anticoagulants: The patient has                            taken no anticoagulant or antiplatelet agents. ASA                            Grade Assessment: II - A patient with mild systemic                            disease. After reviewing the risks and benefits,  the patient was deemed in satisfactory condition to                            undergo the procedure.                           After obtaining informed consent, the colonoscope                            was passed under direct vision. Throughout the                            procedure, the patient's blood pressure, pulse, and                            oxygen saturations were monitored continuously.  The                            CF [id number] #[id number] was introduced through the anus                            and advanced to the the cecum, identified by                            appendiceal orifice and ileocecal valve. The                            ileocecal valve, appendiceal orifice, and rectum                            were photographed. The quality of the bowel                            preparation was excellent. The colonoscopy was                            performed without difficulty. The patient tolerated                            the procedure well. The bowel preparation used was                            SUPREP via split dose instruction. Scope In: 9:29:23 AM Scope Out: 9:40:26 AM Scope Withdrawal Time: 0 hours 8 minutes 53 seconds  Total Procedure Duration: 0 hours 11 minutes 3 seconds  Findings:                 A 5 mm polyp was found in the descending colon. The                            polyp was removed with a cold snare. Resection and                            retrieval were complete.  A less than 1 mm polyp was found in the rectum. The                            polyp was removed with a jumbo cold forceps.                            Resection and retrieval were complete.                           Multiple diverticula were found in the left colon.                           The exam was otherwise without abnormality on                            direct and retroflexion views. Complications:            No immediate complications. Estimated blood loss:                            None. Estimated Blood Loss:     Estimated blood loss: none. Impression:               - One 5 mm polyp in the descending colon, removed                            with a cold snare. Resected and retrieved.                           - One less than 1 mm polyp in the rectum, removed                            with a jumbo cold forceps. Resected and retrieved.                            - Diverticulosis in the left colon.                           - The examination was otherwise normal on direct                            and retroflexion views. Recommendation:           - Repeat colonoscopy in 7-10 years for surveillance.                           - Patient has a contact number available for                            emergencies. The signs and symptoms of potential                            delayed complications were discussed with the  patient. Return to normal activities tomorrow.                            Written discharge instructions were provided to the                            patient.                           - Resume previous diet.                           - Continue present medications.                           - Await pathology results. Wilhemina Bonito. Marina Goodell, MD 09/02/2022 9:50:08 AM This report has been signed electronically.

## 2022-09-02 NOTE — Progress Notes (Signed)
Pt's states no medical or surgical changes since previsit or office visit. 

## 2022-09-03 ENCOUNTER — Telehealth: Payer: Self-pay | Admitting: *Deleted

## 2022-09-03 NOTE — Telephone Encounter (Signed)
  Follow up Call-     09/02/2022    7:47 AM  Call back number  Post procedure Call Back phone  # [phone]  Permission to leave phone message Yes     Patient questions:  Do you have a fever, pain , or abdominal swelling? No. Pain Score  0 *  Have you tolerated food without any problems? Yes.    Have you been able to return to your normal activities? Yes.    Do you have any questions about your discharge instructions: Diet   No. Medications  No. Follow up visit  No.  Do you have questions or concerns about your Care? No.  Actions: * If pain score is 4 or above: No action needed, pain <4.

## 2022-09-04 ENCOUNTER — Encounter: Payer: Self-pay | Admitting: Internal Medicine

## 2023-02-03 ENCOUNTER — Ambulatory Visit: Payer: Medicare Other | Admitting: Dermatology

## 2023-02-03 ENCOUNTER — Encounter: Payer: Self-pay | Admitting: Dermatology

## 2023-02-03 VITALS — BP 123/79 | HR 94

## 2023-02-03 DIAGNOSIS — L820 Inflamed seborrheic keratosis: Secondary | ICD-10-CM | POA: Diagnosis not present

## 2023-02-03 DIAGNOSIS — Z1283 Encounter for screening for malignant neoplasm of skin: Secondary | ICD-10-CM

## 2023-02-03 DIAGNOSIS — L578 Other skin changes due to chronic exposure to nonionizing radiation: Secondary | ICD-10-CM

## 2023-02-03 DIAGNOSIS — D1801 Hemangioma of skin and subcutaneous tissue: Secondary | ICD-10-CM

## 2023-02-03 DIAGNOSIS — D229 Melanocytic nevi, unspecified: Secondary | ICD-10-CM

## 2023-02-03 DIAGNOSIS — L814 Other melanin hyperpigmentation: Secondary | ICD-10-CM | POA: Diagnosis not present

## 2023-02-03 DIAGNOSIS — L821 Other seborrheic keratosis: Secondary | ICD-10-CM

## 2023-02-03 NOTE — Progress Notes (Signed)
   New Patient Visit   Subjective  Kyle Travis is a 67 y.o. male who presents for the following:  Total Body Skin Exam (TBSE)  Patient present today for new patient visit for TBSE.The patient denies he  has spots, moles and lesions to be evaluated, some may be new or changing and the patient may have concern these could be cancer. Patient has previously been treated by a dermatologist (Dr. Jorja Loa 1 year ago)(Last TBSE was 1 year ago). Patient reports he  has hx of bx (Melanoma lower back). Patient denies family history of skin cancers. Patient reports throughout his lifetime has had  severe  sun exposure. Currently, patient reports if he  has excessive sun exposure, he  does not apply sunscreen and/or wears protective coverings.  The following portions of the chart were reviewed this encounter and updated as appropriate: medications, allergies, medical history  Review of Systems:  No other skin or systemic complaints except as noted in HPI or Assessment and Plan.  Objective  Well appearing patient in no apparent distress; mood and affect are within normal limits.  A full examination was performed including scalp, head, eyes, ears, nose, lips, neck, chest, axillae, abdomen, back, buttocks, bilateral upper extremities, bilateral lower extremities, hands, feet, fingers, toes, fingernails, and toenails. All findings within normal limits unless otherwise noted below.   Relevant exam findings are noted in the Assessment and Plan.  Left Forearm - Posterior, Left Upper Back, Right Upper Arm - Posterior Stuck-on, waxy, tan-brown papules and/or plaques         Assessment & Plan   LENTIGINES, SEBORRHEIC KERATOSES, HEMANGIOMAS - Benign normal skin lesions - Benign-appearing - Call for any changes  MELANOCYTIC NEVI - Tan-brown and/or pink-flesh-colored symmetric macules and papules - Benign appearing on exam today - Observation - Call clinic for new or changing moles - Recommend daily  use of broad spectrum spf 30+ sunscreen to sun-exposed areas.   Moderate ACTINIC DAMAGE - Chronic condition, secondary to cumulative UV/sun exposure - diffuse scaly erythematous macules with underlying dyspigmentation - Recommend daily broad spectrum sunscreen SPF 30+ to sun-exposed areas, reapply every 2 hours as needed.  - Staying in the shade or wearing long sleeves, sun glasses (UVA+UVB protection) and wide brim hats (4-inch brim around the entire circumference of the hat) are also recommended for sun protection.  - Call for new or changing lesions.  SKIN CANCER SCREENING PERFORMED TODAY    Inflamed seborrheic keratosis (3) Right Upper Arm - Posterior; Left Forearm - Posterior; Left Upper Back  Symptomatic, irritating, patient would like treated.  Benign-appearing.  Call clinic for new or changing lesions.    Destruction of lesion - Left Forearm - Posterior, Left Upper Back, Right Upper Arm - Posterior Complexity: simple   Destruction method: cryotherapy   Informed consent: discussed and consent obtained   Timeout:  patient name, date of birth, surgical site, and procedure verified Lesion destroyed using liquid nitrogen: Yes   Cryotherapy cycles:  5 Post-procedure details: wound care instructions given    Skin exam for malignant neoplasm  Multiple benign melanocytic nevi  Lentigines  Actinic skin damage  Cherry angioma  Seborrheic keratosis    Return in about 1 year (around 02/03/2024) for TBSE.   Documentation: I have reviewed the above documentation for accuracy and completeness, and I agree with the above.  Stasia Cavalier, am acting as scribe for Langston Reusing, DO.  Langston Reusing, DO

## 2023-02-03 NOTE — Patient Instructions (Addendum)

## 2023-05-17 ENCOUNTER — Ambulatory Visit: Payer: Medicare Other | Admitting: Professional Counselor

## 2023-05-17 ENCOUNTER — Encounter: Payer: Self-pay | Admitting: Professional Counselor

## 2023-05-17 DIAGNOSIS — F4323 Adjustment disorder with mixed anxiety and depressed mood: Secondary | ICD-10-CM
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
## 2023-05-24 ENCOUNTER — Encounter: Payer: Self-pay | Admitting: Professional Counselor

## 2023-05-24 ENCOUNTER — Ambulatory Visit: Payer: Medicare Other | Admitting: Professional Counselor

## 2023-05-24 DIAGNOSIS — F4323 Adjustment disorder with mixed anxiety and depressed mood: Secondary | ICD-10-CM | POA: Diagnosis not present

## 2023-05-24 NOTE — Progress Notes (Signed)
 [facility] Counselor/Therapist Progress Note  Patient ID: Kyle Travis, MRN: [id number],    Date: 05/24/2023  Time Spent: 10:08 AM to 11:10 AM  Treatment Type: Family with patient  Patient present with spouse for session.  Reported Symptoms: Irritability, frustration, emotional dysregulation, interpersonal concerns, low mood, anhedonia  Mental Status Exam:  Appearance:   Neat     Behavior:  Appropriate, Sharing, and Motivated  Motor:  Normal  Speech/Language:   Clear and Coherent and Normal Rate  Affect:  Appropriate and Congruent  Mood:  irritable  Thought process:  normal  Thought content:    WNL  Sensory/Perceptual disturbances:    WNL  Orientation:  oriented to person, place, time/date, and situation  Attention:  Good  Concentration:  Good  Memory:  WNL  Fund of knowledge:   Good  Insight:    Good  Judgment:   Good  Impulse Control:  Good   Risk Assessment: Danger to Self:  No Self-injurious Behavior: No Danger to Others: No Duty to Warn:no Physical Aggression / Violence:No  Access to Firearms a concern: No  Gang Involvement:No   Subjective: Patient presented to session to address concerns of anxiety and depression.  He reported minimal progress at this time.  Patient presented to session with his wife to discuss marital issues he feels exacerbate his symptoms.  Patient and his wife discussed events of the past that have led to distance between them, and ongoing conflict.  They discussed the losses where their social sphere is concerned, and a loss of trust between them, that has implicated their intimacy, sense of companionship, and other aspects to quality of life together.  Counselor assisted in emotional identification skills, recognition of cycle that has developed in relationship, and interpersonal effectiveness skills.  Interventions: Solution-Oriented/Positive Psychology, Humanistic/Existential, Insight-Oriented, and Family Systems  Diagnosis:    ICD-10-CM   1. Adjustment disorder with mixed anxiety and depressed mood  F43.23       Plan: Patient is scheduled for follow-up; continue process work and developing coping skills.  Personal goal between sessions for patient to choose healthy outlets and coping skills to decrease sense of frustration and experience of dysregulation.  Progress note was dictated with Dragon and reviewed for accuracy.  Gaspar Bidding, [facility]  Active     Anxiety     STG: Kyle Travis will participate in at least 80% of scheduled psychotherapy sessions      Start:  05/24/23    Expected End:  10/21/23         LTG: Reduce frequency, duration and intensity of anxiety symptoms and develop coping skills to improve quality of life and improve daily functioning and quality of life     Start:  05/24/23    Expected End:  05/23/24         Work with Kyle Travis to track symptoms, triggers, and/or skill use through a mood chart, diary card, journal or self report     Start:  05/24/23         Perform psychoeducation regarding anxiety disorders     Start:  05/24/23         Work with patient individually to identify the major components of a recent episode of anxiety: physical symptoms, major thoughts and images, and major behaviors they experienced     Start:  05/24/23         Work with Kyle Travis to identify personal goals for managing their anxiety to work on during current treatment.  Start:  05/24/23         Work with Kyle Travis to identify and implement adaptive, alternative coping behaviors      Start:  05/24/23         Continue cognitive-behavioral therapy      Start:  05/24/23         Engage in family counseling to develop interpersonal communication and conflict resolution skills toward alleviation of symptoms      Start:  05/24/23           OP Depression     STG: Kyle Travis will participate in at least 80% of scheduled psychotherapy sessions      Start:  05/24/23    Expected End:  10/21/23          LTG: Reduce frequency, intensity, and duration of depression symptoms so that daily functioning is improved     Start:  05/24/23    Expected End:  05/23/24         LTG: Increase coping skills to manage depression and improve ability to perform daily activities     Start:  05/24/23    Expected End:  05/23/24         Kyle Travis will identify personal goals for managing depression symptoms to work on during the current treatment episode     Start:  05/24/23         Kyle Travis will identify cognitive distortions they are currently using and create reframing statements to replace them     Start:  05/24/23         Therapist will review PLEASE Skills (Treat Physical Illness, Balance Eating, Avoid Mood-Altering Substances, Balance Sleep and Get Exercise) with patient     Start:  05/24/23         Engage in family counseling to develop interpersonal communication and conflict resolution skills toward alleviation of symptoms      Start:  05/24/23

## 2023-05-28 ENCOUNTER — Ambulatory Visit: Payer: Medicare Other | Admitting: Professional Counselor

## 2023-05-28 ENCOUNTER — Encounter: Payer: Self-pay | Admitting: Professional Counselor

## 2023-05-28 DIAGNOSIS — F4323 Adjustment disorder with mixed anxiety and depressed mood: Secondary | ICD-10-CM
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
## 2023-06-03 ENCOUNTER — Encounter: Payer: Self-pay | Admitting: Professional Counselor

## 2023-06-03 ENCOUNTER — Ambulatory Visit: Payer: Medicare Other | Admitting: Professional Counselor

## 2023-06-03 DIAGNOSIS — F4323 Adjustment disorder with mixed anxiety and depressed mood: Secondary | ICD-10-CM | POA: Diagnosis not present
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
## 2023-06-21 ENCOUNTER — Ambulatory Visit (INDEPENDENT_AMBULATORY_CARE_PROVIDER_SITE_OTHER): Payer: Self-pay | Admitting: Professional Counselor

## 2023-06-21 ENCOUNTER — Ambulatory Visit: Payer: Self-pay | Admitting: Professional Counselor

## 2023-06-21 DIAGNOSIS — F4323 Adjustment disorder with mixed anxiety and depressed mood: Secondary | ICD-10-CM | POA: Diagnosis not present

## 2023-07-09 ENCOUNTER — Encounter: Payer: Self-pay | Admitting: Professional Counselor
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
## 2023-08-10 ENCOUNTER — Ambulatory Visit: Admitting: Professional Counselor

## 2024-02-03 ENCOUNTER — Encounter: Payer: Self-pay | Admitting: Dermatology

## 2024-02-03 ENCOUNTER — Ambulatory Visit: Payer: Medicare Other | Admitting: Dermatology

## 2024-02-03 VITALS — BP 124/84

## 2024-02-03 DIAGNOSIS — C44622 Squamous cell carcinoma of skin of right upper limb, including shoulder: Secondary | ICD-10-CM

## 2024-02-03 DIAGNOSIS — L570 Actinic keratosis: Secondary | ICD-10-CM

## 2024-02-03 DIAGNOSIS — W908XXA Exposure to other nonionizing radiation, initial encounter: Secondary | ICD-10-CM

## 2024-02-03 DIAGNOSIS — D485 Neoplasm of uncertain behavior of skin: Secondary | ICD-10-CM | POA: Diagnosis not present

## 2024-02-03 DIAGNOSIS — L578 Other skin changes due to chronic exposure to nonionizing radiation: Secondary | ICD-10-CM | POA: Diagnosis not present

## 2024-02-03 DIAGNOSIS — Z1283 Encounter for screening for malignant neoplasm of skin: Secondary | ICD-10-CM

## 2024-02-03 DIAGNOSIS — D1801 Hemangioma of skin and subcutaneous tissue: Secondary | ICD-10-CM

## 2024-02-03 DIAGNOSIS — D229 Melanocytic nevi, unspecified: Secondary | ICD-10-CM

## 2024-02-03 DIAGNOSIS — L304 Erythema intertrigo: Secondary | ICD-10-CM

## 2024-02-03 DIAGNOSIS — L219 Seborrheic dermatitis, unspecified: Secondary | ICD-10-CM | POA: Diagnosis not present

## 2024-02-03 DIAGNOSIS — L814 Other melanin hyperpigmentation: Secondary | ICD-10-CM

## 2024-02-03 DIAGNOSIS — L821 Other seborrheic keratosis: Secondary | ICD-10-CM

## 2024-02-03 DIAGNOSIS — L720 Epidermal cyst: Secondary | ICD-10-CM

## 2024-02-03 MED ORDER — KETOCONAZOLE 2 % EX SHAM: 1.0000 | MEDICATED_SHAMPOO | Freq: Once | CUTANEOUS | 2 refills | Status: DC

## 2024-02-03 MED ORDER — NYSTATIN-TRIAMCINOLONE 100000-0.1 UNIT/GM-% EX OINT: 1.0000 | TOPICAL_OINTMENT | Freq: Two times a day (BID) | CUTANEOUS | 2 refills | Status: AC

## 2024-02-03 MED ORDER — KETOCONAZOLE 2 % EX CREA
1.0000 | TOPICAL_CREAM | Freq: Two times a day (BID) | CUTANEOUS | 0 refills | Status: AC
Start: 2024-02-03 — End: 2024-03-16

## 2024-02-03 NOTE — Patient Instructions (Addendum)

## 2024-02-03 NOTE — Progress Notes (Signed)
 Total Body Skin Exam (TBSE) Visit   Subjective  Kyle Travis is a 68 y.o. male ESTABLISHED PATIENT who presents for the following:  Total Body Skin Exam (TBSE)  Patient was last evaluated for TBSE on 02/03/23 .  Patient does have spots of concern to be evaluated (R shoulder, L testicle & back). He does not apply sunscreen and/or wears protective coverings. Hx of Bx - melanoma 53yrs ago on L lower back. Reports family Hx of skin cancers (father - unsure of type).   The patient has spots, moles and lesions to be evaluated, some may be new or changing and the patient has concerns that these could be cancer.  The following portions of the chart were reviewed this encounter and updated as appropriate: medications, allergies, medical history  Review of Systems:  No other skin or systemic complaints except as noted in HPI or Assessment and Plan.  Objective  Well appearing patient in no apparent distress; mood and affect are within normal limits.  A full examination was performed including scalp, head, eyes, ears, nose, lips, neck, chest, axillae, abdomen, back, buttocks, bilateral upper extremities, bilateral lower extremities, hands, feet, fingers, toes, fingernails, and toenails. All findings within normal limits unless otherwise noted below.   Relevant physical exam findings are noted in the Assessment and Plan.  Right Shoulder - Anterior 1cm pink crusted papule Left Forearm - Anterior & R temple (4) Erythematous thin papules/macules with gritty scale.     Assessment & Plan   LENTIGINES, SEBORRHEIC KERATOSES, HEMANGIOMAS - Benign normal skin lesions - Benign-appearing - Call for any changes  BENIGN MELANOCYTIC NEVI - Tan-brown and/or pink-flesh-colored symmetric macules and papules - Benign appearing on exam today - Observation - Call clinic for new or changing moles - Recommend daily use of broad spectrum spf 30+ sunscreen to sun-exposed areas.   MILD ACTINIC  DAMAGE - Chronic condition, secondary to cumulative UV/sun exposure - diffuse scaly erythematous macules with underlying dyspigmentation - Recommend daily broad spectrum sunscreen SPF 30+ to sun-exposed areas, reapply every 2 hours as needed.  - Staying in the shade or wearing long sleeves, sun glasses (UVA+UVB protection) and wide brim hats (4-inch brim around the entire circumference of the hat) are also recommended for sun protection.  - Call for new or changing lesions.  SEBORRHEIC DERMATITIS Exam: Pink patches with greasy scale at face & scalp  flared  Treatment Plan: - Rx ketoconazole shampoo -  - Rx ketoconazole cream - apply to face BID until clear on affected areas     EPIDERMAL INCLUSION CYST Exam: Subcutaneous nodule at back  Benign-appearing. Exam most consistent with an epidermal inclusion cyst. Discussed that a cyst is a benign growth that can grow over time and sometimes get irritated or inflamed. Recommend observation if it is not bothersome. Discussed option of surgical excision to remove it if it is growing, symptomatic, or other changes noted. Please call for new or changing lesions so they can be evaluated.   Angiofibroma/Fibrous Papule - 1-2 mm smooth symmetric flesh colored to pink papule(s) without features suspicious for malignancy on dermoscopy - Benign-appearing.  Observation.  Call clinic for new or changing lesions.    INTERTRIGO Exam: Erythematous macerated patches in body folds  flared  Treatment Plan: Nystatin- TMC- apply to affected areas BID for up to 1 week.   SKIN CANCER SCREENING PERFORMED TODAY.   SEBORRHEIC DERMATITIS   Related Medications clobetasol  (OLUX ) 0.05 % topical foam Apply topically daily. ketoconazole (NIZORAL) 2 %  shampoo Apply 1 Application topically once for 1 dose. ketoconazole (NIZORAL) 2 % cream Apply 1 Application topically 2 (two) times daily. NEOPLASM OF UNCERTAIN BEHAVIOR OF SKIN Right Shoulder -  Anterior Skin / nail biopsy Type of biopsy: tangential   Informed consent: discussed and consent obtained   Timeout: patient name, date of birth, surgical site, and procedure verified   Procedure prep:  Patient was prepped and draped in usual sterile fashion Prep type:  Isopropyl alcohol Anesthesia: the lesion was anesthetized in a standard fashion   Anesthetic:  1% lidocaine w/ epinephrine 1-100,000 buffered w/ 8.4% NaHCO3 Instrument used: DermaBlade   Hemostasis achieved with: aluminum chloride   Outcome: patient tolerated procedure well   Post-procedure details: sterile dressing applied and wound care instructions given   Dressing type: petrolatum gauze and bandage    Specimen 1 - Surgical pathology Differential Diagnosis: r/o KA v SCC  Check Margins: yes SKIN EXAM FOR MALIGNANT NEOPLASM   MULTIPLE BENIGN MELANOCYTIC NEVI   CHERRY ANGIOMA   LENTIGINES   SEBORRHEIC KERATOSIS   ACTINIC SKIN DAMAGE   AK (ACTINIC KERATOSIS) (4) Left Forearm - Anterior & R temple (4) Destruction of lesion - Left Forearm - Anterior & R temple (4) Complexity: simple   Destruction method: cryotherapy   Informed consent: discussed and consent obtained   Timeout:  patient name, date of birth, surgical site, and procedure verified Lesion destroyed using liquid nitrogen: Yes   Region frozen until ice ball extended beyond lesion: Yes   Outcome: patient tolerated procedure well with no complications   Post-procedure details: wound care instructions given    Return in about 1 year (around 02/02/2025) for TBSE.   Documentation: I have reviewed the above documentation for accuracy and completeness, and I agree with the above.  I, Shirron Maranda, CMA, am acting as scribe for [employer], DO.   Delon Lenis, DO

## 2024-02-08 LAB — SURGICAL PATHOLOGY

## 2024-02-23 ENCOUNTER — Ambulatory Visit: Payer: Self-pay | Admitting: Dermatology

## 2024-02-23 DIAGNOSIS — C4492 Squamous cell carcinoma of skin, unspecified: Secondary | ICD-10-CM | POA: Insufficient documentation

## 2024-02-23 NOTE — Progress Notes (Signed)
 HI Shirron, Please call pt and notify their bx results were positive for a skin CA that will be excised by Dr. Corey       1. Skin, right shoulder - anterior :       WELL DIFFERENTIATED SQUAMOUS CELL CARCINOMA, BASE INVOLVED

## 2024-04-13 ENCOUNTER — Telehealth: Payer: Self-pay

## 2024-04-13 NOTE — Telephone Encounter (Signed)
 Patient called into office stating the cyst on his back has been flared for a few weeks and he would like to be seen. I schedule pt an appointment and advised if sxs worsen to proceed to PCP or UC for treatment.

## 2024-04-17 ENCOUNTER — Encounter: Payer: Self-pay | Admitting: Dermatology

## 2024-04-17 ENCOUNTER — Ambulatory Visit (INDEPENDENT_AMBULATORY_CARE_PROVIDER_SITE_OTHER): Admitting: Dermatology

## 2024-04-17 VITALS — BP 101/75

## 2024-04-17 DIAGNOSIS — L723 Sebaceous cyst: Secondary | ICD-10-CM

## 2024-04-17 DIAGNOSIS — L219 Seborrheic dermatitis, unspecified: Secondary | ICD-10-CM

## 2024-04-17 DIAGNOSIS — L02212 Cutaneous abscess of back [any part, except buttock]: Secondary | ICD-10-CM | POA: Diagnosis not present

## 2024-04-17 MED ORDER — KETOCONAZOLE 2 % EX SHAM: 1.0000 | MEDICATED_SHAMPOO | Freq: Once | CUTANEOUS | 5 refills | Status: AC

## 2024-04-17 MED ORDER — DOXYCYCLINE HYCLATE 100 MG PO TABS: 100.0000 mg | ORAL_TABLET | Freq: Two times a day (BID) | ORAL | 0 refills | Status: AC

## 2024-04-17 MED ORDER — MUPIROCIN 2 % EX OINT: 1.0000 | TOPICAL_OINTMENT | Freq: Two times a day (BID) | CUTANEOUS | 1 refills | Status: AC

## 2024-04-17 NOTE — Progress Notes (Unsigned)
" ° °  Follow-Up Visit   Subjective  Kyle Travis is a 69 y.o. male established patient who presents for FOLLOW UP on the diagnoses listed below:  Patient was last evaluated on 02/03/24 for TBSE but here today for new issue.  Cyst: Pt stated 3 weeks ago the cyst on his right side of his back had become active again and painful filling with contents. He stated he popped it to relieve some pressure but would like to discuss options for having it removed.   Add On: He stated he would need refills of his ketoconazole  shampoo for his SebDerm.    The following portions of the chart were reviewed this encounter and updated as appropriate: medications, allergies, medical history  Review of Systems:  No other skin or systemic complaints except as noted in HPI or Assessment and Plan.  Objective  Well appearing patient in no apparent distress; mood and affect are within normal limits.   A focused examination was performed of the following areas: R back   Relevant exam findings are noted in the Assessment and Plan.      Right Upper Back Erythematous tender cystic nodule  Assessment & Plan   Sebaceous cyst with cutaneous abscess The sebaceous cyst has evolved into a cutaneous abscess, causing significant pain. The abscess has been partially drained, but there is ongoing accumulation of pus. The infection is localized with no systemic symptoms such as fever or chills. Surgical excision is deferred until the infection resolves to prevent recurrence due to friable sac. - Cleaned the area and made an incision to drain the abscess. - Administered oral doxycyline 100mg  PO BID for 10 days. - Prescribed topical mupirocin  for 10 days. - Cultured the abscess to guide antibiotic therapy if needed. - Advised washing with benzoyl peroxide to prevent further infection. SEBORRHEIC DERMATITIS   This Visit - ketoconazole  (NIZORAL ) 2 % shampoo - Apply 1 Application topically once for 1 dose. Existing  Treatments - clobetasol  (OLUX ) 0.05 % topical foam - Apply topically daily. CUTANEOUS ABSCESS OF BACK EXCLUDING BUTTOCKS Right Upper Back - Incision and Drainage - Right Upper Back Location: R upper back  Informed Consent: Discussed risks (permanent scarring, light or dark discoloration, infection, pain, bleeding, bruising, redness, damage to adjacent structures, and recurrence of the lesion) and benefits of the procedure, as well as the alternatives.  Informed consent was obtained.  Preparation: The area was prepped with hibiclens.  Anesthesia: Lidocaine 1% with epinephrine  Procedure Details: An incision was made overlying the lesion. The lesion drained pus and cyst material. The area was flushed with sterile saline. Ointment and a sterile pressure dressing were applied. The patient tolerated procedure well.  Total number of lesions drained: 1  Plan: The patient was instructed on post-op care. Recommend OTC analgesia as needed for pain.   This Visit - Aerobic Culture - doxycycline  (VIBRA -TABS) 100 MG tablet - Take 1 tablet (100 mg total) by mouth 2 (two) times daily for 10 days. - mupirocin  ointment (BACTROBAN ) 2 % - Apply 1 Application topically 2 (two) times daily. Use until healed Other Procedures Placed This Visit - Specimen status report  No follow-ups on file.   Documentation: I have reviewed the above documentation for accuracy and completeness, and I agree with the above.  I, Shirron Maranda, CMA II, am acting as scribe for:  Delon Lenis, DO "

## 2024-04-17 NOTE — Patient Instructions (Signed)

## 2024-04-19 LAB — AEROBIC CULTURE

## 2024-04-19 LAB — SPECIMEN STATUS REPORT

## 2024-04-27 ENCOUNTER — Encounter: Payer: Self-pay | Admitting: Dermatology

## 2024-05-03 ENCOUNTER — Ambulatory Visit: Admitting: Dermatology

## 2024-05-03 ENCOUNTER — Encounter: Payer: Self-pay | Admitting: Dermatology

## 2024-05-03 VITALS — BP 138/64 | HR 91

## 2024-05-03 DIAGNOSIS — C44622 Squamous cell carcinoma of skin of right upper limb, including shoulder: Secondary | ICD-10-CM

## 2024-05-03 DIAGNOSIS — C4492 Squamous cell carcinoma of skin, unspecified: Secondary | ICD-10-CM

## 2024-05-03 NOTE — Progress Notes (Signed)
 "  Follow-Up Visit   Subjective  Kyle Travis is a 69 y.o. male who presents for the following: Excision of a Well Differentiated Squamous Cell Carcinoma on the right shoulder anterior  The following portions of the chart were reviewed this encounter and updated as appropriate: medications, allergies, medical history  Review of Systems:  No other skin or systemic complaints except as noted in HPI or Assessment and Plan.  Objective  Well appearing patient in no apparent distress; mood and affect are within normal limits.  A focused examination was performed of the following areas: Right shoulder anterior Relevant physical exam findings are noted in the Assessment and Plan.   Right Shoulder - Anterior Pink nodule   Assessment & Plan   SQUAMOUS CELL CARCINOMA OF SKIN Right Shoulder - Anterior - Skin excision  Excision method:  elliptical Lesion length (cm):  2.1 Lesion width (cm):  1 Margin per side (cm):  0.5 Total excision diameter (cm):  3.1 Informed consent: discussed and consent obtained   Timeout: patient name, date of birth, surgical site, and procedure verified   Procedure prep:  Patient was prepped and draped in usual sterile fashion Prep type:  Chlorhexidine Anesthesia: the lesion was anesthetized in a standard fashion   Anesthetic:  1% lidocaine w/ epinephrine 1-100,000 buffered w/ 8.4% NaHCO3 Instrument used: #15 blade   Hemostasis achieved with: suture, pressure and electrodesiccation   Hemostasis achieved with comment:  3.0 PDS with dermabond and steri strips Outcome: patient tolerated procedure well with no complications   Post-procedure details: sterile dressing applied and wound care instructions given   Dressing type: bandage and pressure dressing   Additional details:  Final length 5.0  - Skin repair Complexity:  Complex Final length (cm):  5 Informed consent: discussed and consent obtained   Timeout: patient name, date of birth, surgical site,  and procedure verified   Procedure prep:  Patient was prepped and draped in usual sterile fashion Prep type:  Chlorhexidine Anesthesia: the lesion was anesthetized in a standard fashion   Anesthetic:  1% lidocaine w/ epinephrine 1-100,000 buffered w/ 8.4% NaHCO3 Reason for type of repair: reduce tension to allow closure, allow closure of the large defect and preserve normal anatomy   Undermining: area extensively undermined   Subcutaneous layers (deep stitches):  Suture size:  3-0 Suture type: PDS (polydioxanone)   Stitches:  Buried vertical mattress Fine/surface layer approximation (top stitches):  Suture type: cyanoacrylate tissue glue   Hemostasis achieved with: suture, pressure and electrodesiccation Outcome: patient tolerated procedure well with no complications   Post-procedure details: sterile dressing applied and wound care instructions given   Dressing type: bandage and pressure dressing    Specimen 1 - Surgical pathology Differential Diagnosis: [id number] Check Margins: No  The surgical wound was then cleaned, prepped, and re-anesthetized as above. Wound edges were undermined extensively along at least one entire edge and at a distance equal to or greater than the width of the defect (see wound defect size above) in order to achieve closure and decrease wound tension and anatomic distortion. Redundant tissue repair including standing cone removal was performed. Hemostasis was achieved with electrocautery. Subcutaneous and epidermal tissues were approximated with the above sutures. The surgical site was then lightly scrubbed with sterile, saline-soaked gauze. Steri-strips were applied, and the area was then bandaged using Vaseline ointment, non-adherent gauze, gauze pads, and tape to provide an adequate pressure dressing. The patient tolerated the procedure well, was given detailed written and verbal wound care  instructions, and was discharged in good condition.   The  patient will follow-up: PRN.  Return if symptoms worsen or fail to improve.  I, Darice Smock, CMA, am acting as scribe for RUFUS CHRISTELLA HOLY, MD.   Documentation: I have reviewed the above documentation for accuracy and completeness, and I agree with the above.  RUFUS CHRISTELLA HOLY, MD  "

## 2024-05-03 NOTE — Patient Instructions (Signed)

## 2024-05-04 ENCOUNTER — Ambulatory Visit: Payer: Self-pay | Admitting: Dermatology

## 2024-05-04 LAB — SURGICAL PATHOLOGY

## 2025-02-05 ENCOUNTER — Ambulatory Visit: Admitting: Dermatology
